# Patient Record
Sex: Male | Born: 1965 | Race: White | Hispanic: No | Marital: Single | Smoking: Current every day smoker
Health system: Southern US, Community
[De-identification: ages and names within clinical notes are randomized; demographics above are authoritative.]

## PROBLEM LIST (undated history)

## (undated) DIAGNOSIS — J449 Chronic obstructive pulmonary disease, unspecified: Secondary | ICD-10-CM

## (undated) DIAGNOSIS — J302 Other seasonal allergic rhinitis: Secondary | ICD-10-CM

## (undated) DIAGNOSIS — J45909 Unspecified asthma, uncomplicated: Secondary | ICD-10-CM

## (undated) DIAGNOSIS — F329 Major depressive disorder, single episode, unspecified: Secondary | ICD-10-CM

## (undated) DIAGNOSIS — J189 Pneumonia, unspecified organism: Secondary | ICD-10-CM

## (undated) DIAGNOSIS — M109 Gout, unspecified: Secondary | ICD-10-CM

## (undated) DIAGNOSIS — Z9981 Dependence on supplemental oxygen: Secondary | ICD-10-CM

## (undated) DIAGNOSIS — F419 Anxiety disorder, unspecified: Secondary | ICD-10-CM

## (undated) DIAGNOSIS — F32A Depression, unspecified: Secondary | ICD-10-CM

## (undated) DIAGNOSIS — R0600 Dyspnea, unspecified: Secondary | ICD-10-CM

## (undated) DIAGNOSIS — F319 Bipolar disorder, unspecified: Secondary | ICD-10-CM

## (undated) DIAGNOSIS — K219 Gastro-esophageal reflux disease without esophagitis: Secondary | ICD-10-CM

## (undated) DIAGNOSIS — I10 Essential (primary) hypertension: Secondary | ICD-10-CM

## (undated) DIAGNOSIS — E119 Type 2 diabetes mellitus without complications: Secondary | ICD-10-CM

## (undated) DIAGNOSIS — R490 Dysphonia: Secondary | ICD-10-CM

## (undated) DIAGNOSIS — G4730 Sleep apnea, unspecified: Secondary | ICD-10-CM

## (undated) DIAGNOSIS — E785 Hyperlipidemia, unspecified: Secondary | ICD-10-CM

## (undated) DIAGNOSIS — Z972 Presence of dental prosthetic device (complete) (partial): Secondary | ICD-10-CM

## (undated) DIAGNOSIS — M1990 Unspecified osteoarthritis, unspecified site: Secondary | ICD-10-CM

## (undated) DIAGNOSIS — Z973 Presence of spectacles and contact lenses: Secondary | ICD-10-CM

## (undated) DIAGNOSIS — F819 Developmental disorder of scholastic skills, unspecified: Secondary | ICD-10-CM

## (undated) HISTORY — PX: TONSILLECTOMY: SUR1361

## (undated) HISTORY — PX: OTHER SURGICAL HISTORY: SHX169

## (undated) HISTORY — PX: MULTIPLE TOOTH EXTRACTIONS: SHX2053

---
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
## 2012-05-08 DIAGNOSIS — R05 Cough: Secondary | ICD-10-CM | POA: Insufficient documentation

## 2012-05-08 DIAGNOSIS — IMO0001 Reserved for inherently not codable concepts without codable children: Secondary | ICD-10-CM | POA: Insufficient documentation

## 2012-05-08 DIAGNOSIS — R059 Cough, unspecified: Secondary | ICD-10-CM | POA: Insufficient documentation

## 2012-09-03 DIAGNOSIS — M47817 Spondylosis without myelopathy or radiculopathy, lumbosacral region: Secondary | ICD-10-CM | POA: Insufficient documentation

## 2015-02-26 HISTORY — PX: OTHER SURGICAL HISTORY: SHX169

## 2015-03-12 DIAGNOSIS — E119 Type 2 diabetes mellitus without complications: Secondary | ICD-10-CM | POA: Insufficient documentation

## 2015-03-12 DIAGNOSIS — R0602 Shortness of breath: Secondary | ICD-10-CM | POA: Insufficient documentation

## 2015-03-12 DIAGNOSIS — J449 Chronic obstructive pulmonary disease, unspecified: Secondary | ICD-10-CM | POA: Insufficient documentation

## 2015-03-12 DIAGNOSIS — G4730 Sleep apnea, unspecified: Secondary | ICD-10-CM | POA: Insufficient documentation

## 2015-03-12 DIAGNOSIS — Q792 Exomphalos: Secondary | ICD-10-CM | POA: Insufficient documentation

## 2015-03-16 DIAGNOSIS — L03311 Cellulitis of abdominal wall: Secondary | ICD-10-CM | POA: Insufficient documentation

## 2015-07-15 ENCOUNTER — Ambulatory Visit (INDEPENDENT_AMBULATORY_CARE_PROVIDER_SITE_OTHER): Payer: Medicaid Other | Admitting: Sports Medicine

## 2015-07-15 ENCOUNTER — Encounter: Payer: Self-pay | Admitting: Sports Medicine

## 2015-07-15 DIAGNOSIS — E1142 Type 2 diabetes mellitus with diabetic polyneuropathy: Secondary | ICD-10-CM

## 2015-07-15 DIAGNOSIS — M79671 Pain in right foot: Secondary | ICD-10-CM

## 2015-07-15 DIAGNOSIS — B351 Tinea unguium: Secondary | ICD-10-CM

## 2015-07-15 DIAGNOSIS — L309 Dermatitis, unspecified: Secondary | ICD-10-CM | POA: Diagnosis not present

## 2015-07-15 DIAGNOSIS — M79672 Pain in left foot: Secondary | ICD-10-CM | POA: Diagnosis not present

## 2015-07-15 MED ORDER — CLOTRIMAZOLE-BETAMETHASONE 1-0.05 % EX CREA: 1.0000 "application " | TOPICAL_CREAM | Freq: Two times a day (BID) | CUTANEOUS | Status: DC

## 2015-07-15 NOTE — Progress Notes (Signed)
Patient ID: Dustin Vaughn, male   DOB: 07-05-1965, 50 y.o.   MRN: [id number] Subjective: Dustin Vaughn is a 50 y.o. male patient with history of type 2 diabetes who presents to office today complaining of long, painful nails  while ambulating in shoes; unable to trim. Patient states that the glucose reading this morning was 140 mg/dl. Patient denies any new changes in medication or new problems. Patient denies any new cramping, numbness, burning or tingling in the legs. Reports that he is on lyric for neuropathy pain. No other issues.   There are no active problems to display for this patient.  No current outpatient prescriptions on file prior to visit.   No current facility-administered medications on file prior to visit.   Not on File   Labs: HEMOGLOBIN A1C- no recent lab   Objective: General: Patient is awake, alert, and oriented x 3 and in no acute distress.  Integument: Skin is warm, dry and supple bilateral. Nails are tender, long, thickened and  dystrophic with subungual debris, consistent with onychomycosis, 1-5 bilateral. Mild scaly skin to plantar surfaces of both feet and white stuck on plaques on the lower legs and feet with no signs of infection. No open lesions or preulcerative lesions present bilateral. Remaining integument unremarkable.  Vasculature:  Dorsalis Pedis pulse 2/4 bilateral. Posterior Tibial pulse  1/4 bilateral.  Capillary fill time <3 sec 1-5 bilateral. Scant hair growth to the level of the digits. Temperature gradient within normal limits. No varicosities present bilateral. No edema present bilateral.   Neurology: The patient has diminished sensation measured with a 5.07/10g Semmes Weinstein Monofilament at all pedal sites bilateral . Vibratory sensation diminished bilateral with tuning fork. No Babinski sign present bilateral.   Musculoskeletal: No gross pedal deformities noted bilateral. Muscular strength within normal limits in all lower extremity muscular  groups bilateral without pain or limitation on range of motion . No tenderness with calf compression bilateral.  Assessment and Plan: Problem List Items Addressed This Visit    None    Visit Diagnoses    Dermatophytosis of nail    -  Primary    Relevant Medications    clotrimazole-betamethasone (LOTRISONE) cream    Foot pain, bilateral        Diabetic polyneuropathy associated with type 2 diabetes mellitus (HCC)        Dermatitis        Relevant Medications    clotrimazole-betamethasone (LOTRISONE) cream      -Examined patient. -Discussed and educated patient on diabetic foot care, especially with  regards to the vascular, neurological and musculoskeletal systems.  -Stressed the importance of good glycemic control and the detriment of not  controlling glucose levels in relation to the foot. -Mechanically debrided all nails 1-5 bilateral using sterile nail nipper and filed with dremel without incident  -Rx Lotrisone cream to use to both feet and legs daily  -Answered all patient questions -Patient to return  in 3 months for at risk foot care -Advised patient to consider diabetic education classes; patient states that he will think about it and does not want to do it at this time -Patient advised to call the office if any problems or questions arise in the  Meantime.  Asencion Islam, DPM

## 2015-10-14 ENCOUNTER — Encounter: Payer: Self-pay | Admitting: Sports Medicine

## 2015-10-14 ENCOUNTER — Ambulatory Visit (INDEPENDENT_AMBULATORY_CARE_PROVIDER_SITE_OTHER): Payer: Medicaid Other | Admitting: Sports Medicine

## 2015-10-14 DIAGNOSIS — B351 Tinea unguium: Secondary | ICD-10-CM | POA: Diagnosis not present

## 2015-10-14 DIAGNOSIS — M79672 Pain in left foot: Secondary | ICD-10-CM

## 2015-10-14 DIAGNOSIS — E1142 Type 2 diabetes mellitus with diabetic polyneuropathy: Secondary | ICD-10-CM | POA: Diagnosis not present

## 2015-10-14 DIAGNOSIS — L309 Dermatitis, unspecified: Secondary | ICD-10-CM | POA: Diagnosis not present

## 2015-10-14 DIAGNOSIS — M79671 Pain in right foot: Secondary | ICD-10-CM

## 2015-10-14 NOTE — Progress Notes (Signed)
Patient ID: Dustin Vaughn, male   DOB: August 18, 1965, 50 y.o.   MRN: [id number]   Subjective: Dustin Vaughn is a 50 y.o. male patient with history of type 2 diabetes who presents to office today complaining of long, painful nails  while ambulating in shoes; unable to trim. Patient states that the glucose reading this morning was 164 mg/dl. Patient denies any new changes in medication or new problems. Patient denies any new cramping, numbness, burning or tingling in the legs No other issues.   Patient sees Dr. Tyler Vaughn, Neurology.   There are no active problems to display for this patient.  Current Outpatient Prescriptions on File Prior to Visit  Medication Sig Dispense Refill  . allopurinol (ZYLOPRIM) 100 MG tablet Take 100 mg by mouth daily.    . baclofen (LIORESAL) 10 MG tablet Take 10 mg by mouth 3 (three) times daily.    . Budesonide-Formoterol Fumarate (SYMBICORT IN) Inhale into the lungs.    . clotrimazole-betamethasone (LOTRISONE) cream Apply 1 application topically 2 (two) times daily. 30 g 2  . dexlansoprazole (DEXILANT) 60 MG capsule Take 60 mg by mouth daily.    Marland Kitchen docusate sodium (COLACE) 100 MG capsule Take 100 mg by mouth 2 (two) times daily.    . insulin NPH-regular Human (NOVOLIN 70/30) (70-30) 100 UNIT/ML injection Inject 30 Units into the skin 2 (two) times daily.    Marland Kitchen lisinopril-hydrochlorothiazide (PRINZIDE,ZESTORETIC) 20-12.5 MG tablet Take 1 tablet by mouth daily.    Marland Kitchen loratadine (CLARITIN) 10 MG tablet Take 10 mg by mouth daily.    Marland Kitchen lovastatin (MEVACOR) 10 MG tablet Take 10 mg by mouth at bedtime.    . metoCLOPramide (REGLAN) 10 MG tablet Take 10 mg by mouth 4 (four) times daily.    . mometasone (NASONEX) 50 MCG/ACT nasal spray Place 2 sprays into the nose daily.    . montelukast (SINGULAIR) 10 MG tablet Take 10 mg by mouth at bedtime.    . polyethylene glycol (MIRALAX / GLYCOLAX) packet Take 17 g by mouth daily.    . pregabalin (LYRICA) 200 MG capsule Take 200 mg by mouth 2  (two) times daily.    . psyllium (METAMUCIL) 58.6 % powder Take 1 packet by mouth 3 (three) times daily.    . ranitidine (ZANTAC) 300 MG capsule Take 300 mg by mouth every evening.     No current facility-administered medications on file prior to visit.   Not on File   Objective: General: Patient is awake, alert, and oriented x 3 and in no acute distress.  Integument: Skin is warm, dry and supple bilateral. Nails are tender, long, thickened and  dystrophic with subungual debris, consistent with onychomycosis, 1-5 bilateral. Mild scaly skin to plantar surfaces of both feet and white stuck on plaques on the lower legs and feet with no signs of infection, improving in nature. No open lesions or preulcerative lesions present bilateral. Remaining integument unremarkable.  Vasculature:  Dorsalis Pedis pulse 2/4 bilateral. Posterior Tibial pulse  1/4 bilateral.  Capillary fill time <3 sec 1-5 bilateral. Scant hair growth to the level of the digits. Temperature gradient within normal limits. No varicosities present bilateral. No edema present bilateral.   Neurology: The patient has diminished sensation measured with a 5.07/10g Semmes Weinstein Monofilament at all pedal sites bilateral . Vibratory sensation diminished bilateral with tuning fork. No Babinski sign present bilateral.   Musculoskeletal: No gross pedal deformities noted bilateral. Muscular strength within normal limits in all lower extremity muscular groups bilateral without pain  or limitation on range of motion . No tenderness with calf compression bilateral.  Assessment and Plan: Problem List Items Addressed This Visit    None    Visit Diagnoses    Dermatophytosis of nail    -  Primary    Foot pain, bilateral        Diabetic polyneuropathy associated with type 2 diabetes mellitus (HCC)        Dermatitis          -Examined patient. -Discussed and educated patient on diabetic foot care, especially with  regards to the vascular,  neurological and musculoskeletal systems.  -Stressed the importance of good glycemic control and the detriment of not  controlling glucose levels in relation to the foot. -Mechanically debrided all nails 1-5 bilateral using sterile nail nipper and filed with dremel without incident  -Cont with Lotrisone cream to use to both feet and legs daily  -Answered all patient questions -Patient to return  in 3 months for at risk foot care -Patient advised to call the office if any problems or questions arise in the meantime.  Dustin Vaughn, DPM

## 2015-10-22 DIAGNOSIS — H6120 Impacted cerumen, unspecified ear: Secondary | ICD-10-CM | POA: Insufficient documentation

## 2015-10-22 DIAGNOSIS — H7290 Unspecified perforation of tympanic membrane, unspecified ear: Secondary | ICD-10-CM | POA: Insufficient documentation

## 2015-10-22 DIAGNOSIS — R490 Dysphonia: Secondary | ICD-10-CM | POA: Insufficient documentation

## 2015-10-22 DIAGNOSIS — H6980 Other specified disorders of Eustachian tube, unspecified ear: Secondary | ICD-10-CM | POA: Insufficient documentation

## 2016-01-13 ENCOUNTER — Ambulatory Visit (INDEPENDENT_AMBULATORY_CARE_PROVIDER_SITE_OTHER): Payer: Medicaid Other | Admitting: Sports Medicine

## 2016-01-13 ENCOUNTER — Encounter: Payer: Self-pay | Admitting: Sports Medicine

## 2016-01-13 DIAGNOSIS — E1142 Type 2 diabetes mellitus with diabetic polyneuropathy: Secondary | ICD-10-CM

## 2016-01-13 DIAGNOSIS — B351 Tinea unguium: Secondary | ICD-10-CM | POA: Diagnosis not present

## 2016-01-13 DIAGNOSIS — M79672 Pain in left foot: Secondary | ICD-10-CM | POA: Diagnosis not present

## 2016-01-13 DIAGNOSIS — M79671 Pain in right foot: Secondary | ICD-10-CM

## 2016-01-13 NOTE — Progress Notes (Signed)
Patient ID: Dustin Vaughn, male   DOB: 03/07/1966, 50 y.o.   MRN: [id number]  Subjective: Dustin SauerJerry Vaughn is a 50 y.o. male patient with history of type 2 diabetes who presents to office today complaining of long, painful nails  while ambulating in shoes; unable to trim. Patient states that the glucose reading this morning not recorded but has been up and down. Patient denies any new changes in medication or new problems. Patient denies any new cramping, numbness, burning or tingling in the legs No other issues.   Patient sees Dr. Tyler DeisWheeler, Neurology.   Patient Active Problem List   Diagnosis Date Noted  . Dysfunction of eustachian tube 10/22/2015  . Ceruminosis 10/22/2015  . Hoarse 10/22/2015  . Ear drum perforation 10/22/2015  . Cellulitis of abdominal wall 03/16/2015  . Chronic obstructive pulmonary disease (HCC) 03/12/2015  . Diabetes mellitus (HCC) 03/12/2015  . Breath shortness 03/12/2015  . Apnea, sleep 03/12/2015  . Exomphalos 03/12/2015  . LBP (low back pain) 09/03/2012  . Lumbosacral spondylosis 09/03/2012  . Cough 05/08/2012  . Can't get food down 05/08/2012  . Brash 05/08/2012   Current Outpatient Prescriptions on File Prior to Visit  Medication Sig Dispense Refill  . allopurinol (ZYLOPRIM) 100 MG tablet Take 100 mg by mouth daily.    . baclofen (LIORESAL) 10 MG tablet Take 10 mg by mouth 3 (three) times daily.    . Budesonide-Formoterol Fumarate (SYMBICORT IN) Inhale into the lungs.    . clotrimazole-betamethasone (LOTRISONE) cream Apply 1 application topically 2 (two) times daily. 30 g 2  . dexlansoprazole (DEXILANT) 60 MG capsule Take 60 mg by mouth daily.    Marland Kitchen. docusate sodium (COLACE) 100 MG capsule Take 100 mg by mouth 2 (two) times daily.    Marland Kitchen. ibuprofen (ADVIL,MOTRIN) 800 MG tablet Take by mouth.    . insulin NPH-regular Human (NOVOLIN 70/30) (70-30) 100 UNIT/ML injection Inject 30 Units into the skin 2 (two) times daily.    Marland Kitchen. lisinopril-hydrochlorothiazide  (PRINZIDE,ZESTORETIC) 20-12.5 MG tablet Take 1 tablet by mouth daily.    Marland Kitchen. loratadine (CLARITIN) 10 MG tablet Take 10 mg by mouth daily.    Marland Kitchen. lovastatin (MEVACOR) 10 MG tablet Take 10 mg by mouth at bedtime.    . metoCLOPramide (REGLAN) 10 MG tablet Take 10 mg by mouth 4 (four) times daily.    . mometasone (NASONEX) 50 MCG/ACT nasal spray Place 2 sprays into the nose daily.    . montelukast (SINGULAIR) 10 MG tablet Take 10 mg by mouth at bedtime.    . polyethylene glycol (MIRALAX / GLYCOLAX) packet Take 17 g by mouth daily.    . pregabalin (LYRICA) 200 MG capsule Take 200 mg by mouth 2 (two) times daily.    . psyllium (METAMUCIL) 58.6 % powder Take 1 packet by mouth 3 (three) times daily.    . ranitidine (ZANTAC) 300 MG capsule Take 300 mg by mouth every evening.     No current facility-administered medications on file prior to visit.   No Known Allergies   Objective: General: Patient is awake, alert, and oriented x 3 and in no acute distress.  Integument: Skin is warm, dry and supple bilateral. Nails are tender, long, thickened and  dystrophic with subungual debris, consistent with onychomycosis, 1-5 bilateral. Mild scaly skin to plantar surfaces of both feet and white stuck on plaques on the lower legs and feet with no signs of infection, improving in nature. No open lesions or preulcerative lesions present bilateral. Remaining integument unremarkable.  Vasculature:  Dorsalis Pedis pulse 2/4 bilateral. Posterior Tibial pulse  1/4 bilateral.  Capillary fill time <3 sec 1-5 bilateral. Scant hair growth to the level of the digits. Temperature gradient within normal limits. No varicosities present bilateral. No edema present bilateral.   Neurology: The patient has diminished sensation measured with a 5.07/10g Semmes Weinstein Monofilament at all pedal sites bilateral . Vibratory sensation diminished bilateral with tuning fork. No Babinski sign present bilateral.   Musculoskeletal: No gross  pedal deformities noted bilateral. Muscular strength within normal limits in all lower extremity muscular groups bilateral without pain or limitation on range of motion . No tenderness with calf compression bilateral.  Assessment and Plan: Problem List Items Addressed This Visit    None    Visit Diagnoses    Dermatophytosis of nail    -  Primary    Foot pain, bilateral        Diabetic polyneuropathy associated with type 2 diabetes mellitus (HCC)          -Examined patient. -Discussed and educated patient on diabetic foot care, especially with  regards to the vascular, neurological and musculoskeletal systems.  -Stressed the importance of good glycemic control and the detriment of not  controlling glucose levels in relation to the foot. -Mechanically debrided all nails 1-5 bilateral using sterile nail nipper and filed with dremel without incident  -Cont with Lotrisone cream to use to both feet and legs daily  -Answered all patient questions -Patient to return  in 3 months for at risk foot care -Patient advised to call the office if any problems or questions arise in the meantime.  Asencion Islam, DPM

## 2016-04-13 ENCOUNTER — Encounter: Payer: Self-pay | Admitting: Sports Medicine

## 2016-04-13 ENCOUNTER — Ambulatory Visit (INDEPENDENT_AMBULATORY_CARE_PROVIDER_SITE_OTHER): Payer: Medicaid Other | Admitting: Sports Medicine

## 2016-04-13 DIAGNOSIS — M79671 Pain in right foot: Secondary | ICD-10-CM | POA: Diagnosis not present

## 2016-04-13 DIAGNOSIS — B351 Tinea unguium: Secondary | ICD-10-CM

## 2016-04-13 DIAGNOSIS — M79672 Pain in left foot: Secondary | ICD-10-CM | POA: Diagnosis not present

## 2016-04-13 DIAGNOSIS — E1142 Type 2 diabetes mellitus with diabetic polyneuropathy: Secondary | ICD-10-CM

## 2016-04-13 DIAGNOSIS — M2040 Other hammer toe(s) (acquired), unspecified foot: Secondary | ICD-10-CM

## 2016-04-13 NOTE — Progress Notes (Signed)
Patient ID: Dustin Vaughn, male   DOB: 1965-08-15, 50 y.o.   MRN: [id number]  Subjective: Dustin Vaughn is a 50 y.o. male patient with history of type 2 diabetes who presents to office today complaining of long, painful nails  while ambulating in shoes; unable to trim. Patient states that the glucose reading this morning not recorded but has been :good". Desires diabetic shoes. Patient denies any new changes in medication or new problems. Patient denies any new cramping, numbness, burning or tingling in the legs No other issues.   Patient sees Dr. Tyler Deis, Neurology.   Patient Active Problem List   Diagnosis Date Noted  . Dysfunction of eustachian tube 10/22/2015  . Ceruminosis 10/22/2015  . Hoarse 10/22/2015  . Ear drum perforation 10/22/2015  . Cellulitis of abdominal wall 03/16/2015  . Chronic obstructive pulmonary disease (HCC) 03/12/2015  . Diabetes mellitus (HCC) 03/12/2015  . Breath shortness 03/12/2015  . Apnea, sleep 03/12/2015  . Exomphalos 03/12/2015  . LBP (low back pain) 09/03/2012  . Lumbosacral spondylosis 09/03/2012  . Cough 05/08/2012  . Can't get food down 05/08/2012  . Brash 05/08/2012   Current Outpatient Prescriptions on File Prior to Visit  Medication Sig Dispense Refill  . allopurinol (ZYLOPRIM) 100 MG tablet Take 100 mg by mouth daily.    . baclofen (LIORESAL) 10 MG tablet Take 10 mg by mouth 3 (three) times daily.    . Budesonide-Formoterol Fumarate (SYMBICORT IN) Inhale into the lungs.    . clotrimazole-betamethasone (LOTRISONE) cream Apply 1 application topically 2 (two) times daily. 30 g 2  . dexlansoprazole (DEXILANT) 60 MG capsule Take 60 mg by mouth daily.    Marland Kitchen docusate sodium (COLACE) 100 MG capsule Take 100 mg by mouth 2 (two) times daily.    Marland Kitchen ibuprofen (ADVIL,MOTRIN) 800 MG tablet Take by mouth.    . insulin NPH-regular Human (NOVOLIN 70/30) (70-30) 100 UNIT/ML injection Inject 30 Units into the skin 2 (two) times daily.    Marland Kitchen  lisinopril-hydrochlorothiazide (PRINZIDE,ZESTORETIC) 20-12.5 MG tablet Take 1 tablet by mouth daily.    Marland Kitchen loratadine (CLARITIN) 10 MG tablet Take 10 mg by mouth daily.    Marland Kitchen lovastatin (MEVACOR) 10 MG tablet Take 10 mg by mouth at bedtime.    . metoCLOPramide (REGLAN) 10 MG tablet Take 10 mg by mouth 4 (four) times daily.    . mometasone (NASONEX) 50 MCG/ACT nasal spray Place 2 sprays into the nose daily.    . montelukast (SINGULAIR) 10 MG tablet Take 10 mg by mouth at bedtime.    . polyethylene glycol (MIRALAX / GLYCOLAX) packet Take 17 g by mouth daily.    . pregabalin (LYRICA) 200 MG capsule Take 200 mg by mouth 2 (two) times daily.    . psyllium (METAMUCIL) 58.6 % powder Take 1 packet by mouth 3 (three) times daily.    . ranitidine (ZANTAC) 300 MG capsule Take 300 mg by mouth every evening.     No current facility-administered medications on file prior to visit.    No Known Allergies   Objective: General: Patient is awake, alert, and oriented x 3 and in no acute distress.  Integument: Skin is warm, dry and supple bilateral. Nails are tender, long, thickened and  dystrophic with subungual debris, consistent with onychomycosis, 1-5 bilateral. Mild scaly skin to plantar surfaces of both feet and white stuck on plaques on the lower legs and feet with no signs of infection, improving in nature. No open lesions or preulcerative lesions present bilateral. Remaining integument  unremarkable.  Vasculature:  Dorsalis Pedis pulse 2/4 bilateral. Posterior Tibial pulse  1/4 bilateral.  Capillary fill time <3 sec 1-5 bilateral. Scant hair growth to the level of the digits. Temperature gradient within normal limits. No varicosities present bilateral. No edema present bilateral.   Neurology: The patient has diminished sensation measured with a 5.07/10g Semmes Weinstein Monofilament at all pedal sites bilateral . Vibratory sensation diminished bilateral with tuning fork. No Babinski sign present  bilateral.   Musculoskeletal: Mild Asymptomatic hammertoe pedal deformities noted bilateral. Muscular strength within normal limits in all lower extremity muscular groups bilateral without pain or limitation on range of motion . No tenderness with calf compression bilateral.  Assessment and Plan: Problem List Items Addressed This Visit    None    Visit Diagnoses    Dermatophytosis of nail    -  Primary   Foot pain, bilateral       Diabetic polyneuropathy associated with type 2 diabetes mellitus (HCC)       Hammer toe, unspecified laterality         -Examined patient. -Discussed and educated patient on diabetic foot care, especially with  regards to the vascular, neurological and musculoskeletal systems.  -Stressed the importance of good glycemic control and the detriment of not  controlling glucose levels in relation to the foot. -Mechanically debrided all nails 1-5 bilateral using sterile nail nipper and filed with dremel without incident  -Cont with Lotrisone cream to use to both feet and legs daily  -Rx Diabetic shoes [facility]  -Answered all patient questions -Patient to return  in 3 months for at risk foot care -Patient advised to call the office if any problems or questions arise in the meantime.  Dustin Vaughn, DPM

## 2016-04-27 HISTORY — PX: OTHER SURGICAL HISTORY: SHX169

## 2016-07-14 ENCOUNTER — Ambulatory Visit: Payer: Medicaid Other | Admitting: Sports Medicine

## 2016-07-20 ENCOUNTER — Ambulatory Visit (INDEPENDENT_AMBULATORY_CARE_PROVIDER_SITE_OTHER): Payer: Medicaid Other | Admitting: Sports Medicine

## 2016-07-20 ENCOUNTER — Encounter: Payer: Self-pay | Admitting: Sports Medicine

## 2016-07-20 DIAGNOSIS — M79676 Pain in unspecified toe(s): Secondary | ICD-10-CM

## 2016-07-20 DIAGNOSIS — M2040 Other hammer toe(s) (acquired), unspecified foot: Secondary | ICD-10-CM

## 2016-07-20 DIAGNOSIS — M79672 Pain in left foot: Secondary | ICD-10-CM

## 2016-07-20 DIAGNOSIS — B351 Tinea unguium: Secondary | ICD-10-CM | POA: Diagnosis not present

## 2016-07-20 DIAGNOSIS — M79671 Pain in right foot: Secondary | ICD-10-CM

## 2016-07-20 DIAGNOSIS — E1142 Type 2 diabetes mellitus with diabetic polyneuropathy: Secondary | ICD-10-CM

## 2016-07-20 NOTE — Progress Notes (Signed)
Patient ID: Dustin Vaughn, male   DOB: 18-Aug-1965, 51 y.o.   MRN: [id number]  Subjective: Dustin Vaughn is a 51 y.o. male patient with history of type 2 diabetes who presents to office today complaining of long, painful nails  while ambulating in shoes; unable to trim. Patient states that the glucose reading this morning not recorded but has been :good". Has diabetic shoes. Patient denies any new changes in medication or new problems. Patient denies any new cramping, numbness, burning or tingling in the legs No other issues.   Patient sees Dr. Tyler Vaughn, Neurology.   Patient Active Problem List   Diagnosis Date Noted  . Dysfunction of eustachian tube 10/22/2015  . Ceruminosis 10/22/2015  . Hoarse 10/22/2015  . Ear drum perforation 10/22/2015  . Cellulitis of abdominal wall 03/16/2015  . Chronic obstructive pulmonary disease (HCC) 03/12/2015  . Diabetes mellitus (HCC) 03/12/2015  . Breath shortness 03/12/2015  . Apnea, sleep 03/12/2015  . Exomphalos 03/12/2015  . LBP (low back pain) 09/03/2012  . Lumbosacral spondylosis 09/03/2012  . Cough 05/08/2012  . Can't get food down 05/08/2012  . Brash 05/08/2012   Current Outpatient Prescriptions on File Prior to Visit  Medication Sig Dispense Refill  . allopurinol (ZYLOPRIM) 100 MG tablet Take 100 mg by mouth daily.    . baclofen (LIORESAL) 10 MG tablet Take 10 mg by mouth 3 (three) times daily.    . Budesonide-Formoterol Fumarate (SYMBICORT IN) Inhale into the lungs.    . clotrimazole-betamethasone (LOTRISONE) cream Apply 1 application topically 2 (two) times daily. 30 g 2  . dexlansoprazole (DEXILANT) 60 MG capsule Take 60 mg by mouth daily.    Marland Kitchen docusate sodium (COLACE) 100 MG capsule Take 100 mg by mouth 2 (two) times daily.    Marland Kitchen ibuprofen (ADVIL,MOTRIN) 800 MG tablet Take by mouth.    . insulin NPH-regular Human (NOVOLIN 70/30) (70-30) 100 UNIT/ML injection Inject 30 Units into the skin 2 (two) times daily.    Marland Kitchen  lisinopril-hydrochlorothiazide (PRINZIDE,ZESTORETIC) 20-12.5 MG tablet Take 1 tablet by mouth daily.    Marland Kitchen loratadine (CLARITIN) 10 MG tablet Take 10 mg by mouth daily.    Marland Kitchen lovastatin (MEVACOR) 10 MG tablet Take 10 mg by mouth at bedtime.    . metoCLOPramide (REGLAN) 10 MG tablet Take 10 mg by mouth 4 (four) times daily.    . mometasone (NASONEX) 50 MCG/ACT nasal spray Place 2 sprays into the nose daily.    . montelukast (SINGULAIR) 10 MG tablet Take 10 mg by mouth at bedtime.    . polyethylene glycol (MIRALAX / GLYCOLAX) packet Take 17 g by mouth daily.    . pregabalin (LYRICA) 200 MG capsule Take 200 mg by mouth 2 (two) times daily.    . psyllium (METAMUCIL) 58.6 % powder Take 1 packet by mouth 3 (three) times daily.    . ranitidine (ZANTAC) 300 MG capsule Take 300 mg by mouth every evening.     No current facility-administered medications on file prior to visit.    No Known Allergies   Objective: General: Patient is awake, alert, and oriented x 3 and in no acute distress.  Integument: Skin is warm, dry and supple bilateral. Nails are tender, long, thickened and  dystrophic with subungual debris, consistent with onychomycosis, 1-5 bilateral. Mild scaly skin to plantar surfaces of both feet and white stuck on plaques on the lower legs and feet with no signs of infection, improving in nature. No open lesions or preulcerative lesions present bilateral. Remaining integument  unremarkable.  Vasculature:  Dorsalis Pedis pulse 2/4 bilateral. Posterior Tibial pulse  1/4 bilateral.  Capillary fill time <3 sec 1-5 bilateral. Scant hair growth to the level of the digits. Temperature gradient within normal limits. No varicosities present bilateral. No edema present bilateral.   Neurology: The patient has diminished sensation measured with a 5.07/10g Semmes Weinstein Monofilament at all pedal sites bilateral . Vibratory sensation diminished bilateral with tuning fork. No Babinski sign present  bilateral.   Musculoskeletal: Mild Asymptomatic hammertoe pedal deformities noted bilateral. Muscular strength within normal limits in all lower extremity muscular groups bilateral without pain or limitation on range of motion . No tenderness with calf compression bilateral.  Assessment and Plan: Problem List Items Addressed This Visit    None    Visit Diagnoses    Dermatophytosis of nail    -  Primary   Hammer toe, unspecified laterality       Foot pain, bilateral       Diabetic polyneuropathy associated with type 2 diabetes mellitus (HCC)         -Examined patient. -Discussed and educated patient on diabetic foot care, especially with  regards to the vascular, neurological and musculoskeletal systems.  -Stressed the importance of good glycemic control and the detriment of not  controlling glucose levels in relation to the foot. -Mechanically debrided all nails 1-5 bilateral using sterile nail nipper and filed with dremel without incident  -Cont with Lotrisone cream to use to both feet and legs daily  -Continue with diabetic shoes from [facility]  -Answered all patient questions -Patient to return  in 3 months for at risk foot care -Patient advised to call the office if any problems or questions arise in the meantime.  Dustin Vaughn, DPM

## 2016-10-19 ENCOUNTER — Ambulatory Visit: Payer: Medicaid Other | Admitting: Sports Medicine

## 2019-08-02 ENCOUNTER — Other Ambulatory Visit: Payer: Self-pay

## 2019-08-02 ENCOUNTER — Emergency Department (HOSPITAL_COMMUNITY): Payer: Medicaid Other

## 2019-08-02 ENCOUNTER — Emergency Department (HOSPITAL_COMMUNITY)
Admission: EM | Admit: 2019-08-02 | Discharge: 2019-08-02 | Disposition: A | Discharge status: Home / Self Care | Payer: Medicaid Other | Attending: Emergency Medicine | Admitting: Emergency Medicine

## 2019-08-02 ENCOUNTER — Encounter (HOSPITAL_COMMUNITY): Payer: Self-pay | Admitting: Emergency Medicine

## 2019-08-02 DIAGNOSIS — S6991XD Unspecified injury of right wrist, hand and finger(s), subsequent encounter: Secondary | ICD-10-CM | POA: Diagnosis present

## 2019-08-02 DIAGNOSIS — Z79899 Other long term (current) drug therapy: Secondary | ICD-10-CM | POA: Diagnosis not present

## 2019-08-02 DIAGNOSIS — Y999 Unspecified external cause status: Secondary | ICD-10-CM | POA: Diagnosis not present

## 2019-08-02 DIAGNOSIS — S52691K Other fracture of lower end of right ulna, subsequent encounter for closed fracture with nonunion: Secondary | ICD-10-CM | POA: Insufficient documentation

## 2019-08-02 DIAGNOSIS — S52501K Unspecified fracture of the lower end of right radius, subsequent encounter for closed fracture with nonunion: Secondary | ICD-10-CM

## 2019-08-02 DIAGNOSIS — Y929 Unspecified place or not applicable: Secondary | ICD-10-CM | POA: Insufficient documentation

## 2019-08-02 DIAGNOSIS — E119 Type 2 diabetes mellitus without complications: Secondary | ICD-10-CM | POA: Insufficient documentation

## 2019-08-02 DIAGNOSIS — W010XXA Fall on same level from slipping, tripping and stumbling without subsequent striking against object, initial encounter: Secondary | ICD-10-CM | POA: Insufficient documentation

## 2019-08-02 DIAGNOSIS — Z794 Long term (current) use of insulin: Secondary | ICD-10-CM | POA: Insufficient documentation

## 2019-08-02 DIAGNOSIS — Y939 Activity, unspecified: Secondary | ICD-10-CM | POA: Diagnosis not present

## 2019-08-02 MED ORDER — NICOTINE 21 MG/24HR TD PT24
21.0000 mg | MEDICATED_PATCH | Freq: Every day | TRANSDERMAL | Status: DC
  Administered 2019-08-02: 21 mg via TRANSDERMAL
  Filled 2019-08-02: qty 1

## 2019-08-02 MED ORDER — OXYCODONE HCL 5 MG PO TABS
10.0000 mg | ORAL_TABLET | Freq: Once | ORAL | Status: AC
  Administered 2019-08-02: 18:00:00 10 mg via ORAL
  Filled 2019-08-02: qty 2

## 2019-08-02 MED ORDER — OXYCODONE HCL 5 MG PO TABS
5.0000 mg | ORAL_TABLET | Freq: Once | ORAL | Status: AC
  Administered 2019-08-02: 5 mg via ORAL
  Filled 2019-08-02: qty 1

## 2019-08-02 NOTE — Progress Notes (Signed)
Orthopedic Tech Progress Note Patient Details:  Dustin Vaughn 1965-11-08 [id number]  Ortho Devices Type of Ortho Device: Arm sling, Volar splint Ortho Device/Splint Location: RUE Ortho Device/Splint Interventions: Application, Ordered   Post Interventions Patient Tolerated: Well Instructions Provided: Care of device, Adjustment of device   Donald Pore 08/02/2019, 7:01 PM

## 2019-08-02 NOTE — Discharge Instructions (Signed)
1. Medications: alternate naprosyn and tylenol for pain control, usual home medications 2. Treatment: rest, ice, elevate and use brace, drink plenty of fluids, gentle stretching 3. Follow Up: Please followup with orthopedics as directed for definitive treatment and/or repair; Please return to the ER for worsening symptoms or other concerns

## 2019-08-02 NOTE — ED Triage Notes (Signed)
Pt c/o right wrist pain and fracture x 3 months. States that he was supposed to have surgery on wrist but it was cancelled. Pt not wearing splint.

## 2019-08-02 NOTE — ED Provider Notes (Signed)
[facility] EMERGENCY DEPARTMENT Provider Note   CSN: [id number] Arrival date & time: 08/02/19  1607     History Chief Complaint  Patient presents with  . Wrist Pain    Dustin Vaughn is a 54 y.o. male with a hx of COPD non-insulin-dependent diabetes, chronic low back pain presents to the Emergency Department complaining of acute, persistent pain in the right wrist.  Patient reports he broke his wrist in September 2020.  He reports he wore a cast for period of time and afterwards a wrist splint.  2 weeks ago he reports he fell creating the deformity that he currently has.  He was not evaluated at that time.  Patient reports he has not had any pain medicine for this and has been smoking marijuana to help instead.  Movement and palpation make his pain significantly worse.  Nothing seems to make it better.  Review of the [place] narcotic database does show that patient has a consistent prescription for Suboxone which was last filled on 07/17/2019.  Patient reports significant pain and deformity in the right wrist.  He reports intact sensation to the right hand and movement of all fingers but this does create significant pain.  Additional records reviewed.  Patient was diagnosed with this fracture at [facility] and evaluated by an orthopedist with [facility] however they did not do surgery.  Patient reports this is because he continues to smoke.   The history is provided by the patient and medical records. No language interpreter was used.       History reviewed. No pertinent past medical history.  Patient Active Problem List   Diagnosis Date Noted  . Dysfunction of eustachian tube 10/22/2015  . Ceruminosis 10/22/2015  . Hoarse 10/22/2015  . Ear drum perforation 10/22/2015  . Cellulitis of abdominal wall 03/16/2015  . Chronic obstructive pulmonary disease ([place]) 03/12/2015  . Diabetes mellitus ([place]) 03/12/2015  . Breath shortness 03/12/2015  . Apnea, sleep  03/12/2015  . Exomphalos 03/12/2015  . LBP (low back pain) 09/03/2012  . Lumbosacral spondylosis 09/03/2012  . Cough 05/08/2012  . Can't get food down 05/08/2012  . Brash 05/08/2012    No family history on file.  Social History   Tobacco Use  . Smoking status: Unknown If Ever Smoked  . Smokeless tobacco: Never Used  Substance Use Topics  . Alcohol use: Not on file  . Drug use: Not on file    Home Medications Prior to Admission medications   Medication Sig Start Date End Date Taking? Authorizing Provider  allopurinol (ZYLOPRIM) 100 MG tablet Take 100 mg by mouth daily.    [provider]  baclofen (LIORESAL) 10 MG tablet Take 10 mg by mouth 3 (three) times daily.    [provider]  Budesonide-Formoterol Fumarate (SYMBICORT IN) Inhale into the lungs.    [provider]  clotrimazole-betamethasone (LOTRISONE) cream Apply 1 application topically 2 (two) times daily. 07/15/15   Landis Martins, DPM  dexlansoprazole (DEXILANT) 60 MG capsule Take 60 mg by mouth daily.    [provider]  docusate sodium (COLACE) 100 MG capsule Take 100 mg by mouth 2 (two) times daily.    [provider]  ibuprofen (ADVIL,MOTRIN) 800 MG tablet Take by mouth. 05/17/12   [provider]  insulin NPH-regular Human (NOVOLIN 70/30) (70-30) 100 UNIT/ML injection Inject 30 Units into the skin 2 (two) times daily.    [provider]  lisinopril-hydrochlorothiazide (PRINZIDE,ZESTORETIC) 20-12.5 MG tablet Take 1  tablet by mouth daily.    [provider]  loratadine (CLARITIN) 10 MG tablet Take 10 mg by mouth daily.    [provider]  lovastatin (MEVACOR) 10 MG tablet Take 10 mg by mouth at bedtime.    [provider]  metoCLOPramide (REGLAN) 10 MG tablet Take 10 mg by mouth 4 (four) times daily.    [provider]  mometasone (NASONEX) 50 MCG/ACT nasal spray Place 2 sprays into the nose daily.    [provider]  montelukast (SINGULAIR) 10 MG tablet Take 10 mg by mouth at bedtime.    [provider]  polyethylene glycol (MIRALAX / GLYCOLAX) packet Take 17 g by mouth daily.    [provider]  pregabalin (LYRICA) 200 MG capsule Take 200 mg by mouth 2 (two) times daily.    [provider]  psyllium (METAMUCIL) 58.6 % powder Take 1 packet by mouth 3 (three) times daily.    [provider]  ranitidine (ZANTAC) 300 MG capsule Take 300 mg by mouth every evening.    [provider]    Allergies    Patient has no known allergies.  Review of Systems   Review of Systems  Constitutional: Negative for chills and fever.  Gastrointestinal: Negative for nausea and vomiting.  Musculoskeletal: Positive for arthralgias and joint swelling. Negative for back pain, neck pain and neck stiffness.  Skin: Negative for wound.  Neurological: Negative for numbness.  Hematological: Does not bruise/bleed easily.  Psychiatric/Behavioral: The patient is not nervous/anxious.   All other systems reviewed and are negative.   Physical Exam Updated Vital Signs BP (!) 110/91   Pulse 64   Temp 97.9 F (36.6 C) (Oral)   Resp (!) 22   SpO2 97%   Physical Exam Vitals and nursing note reviewed.  Constitutional:      General: He is not in acute distress.    Appearance: He is well-developed. He is not diaphoretic.  HENT:     Head: Normocephalic and atraumatic.  Eyes:     Conjunctiva/sclera: Conjunctivae normal.  Cardiovascular:     Rate and Rhythm: Normal rate and regular rhythm.     Comments: Capillary refill < 3 sec Pulmonary:     Effort: Pulmonary effort is normal.     Breath sounds: Normal breath sounds.  Musculoskeletal:        General: Tenderness present.     Right wrist: Swelling, deformity, tenderness and bony tenderness present. Decreased range of motion.     Left wrist: No swelling, deformity, tenderness or bony tenderness. Normal range of motion.      Right hand: No tenderness. Normal range of motion. Normal sensation.     Left hand: No tenderness. Normal range of motion. Normal sensation.     Cervical back: Normal range of motion.     Comments: Significant deformity of the right wrist with tenting of the skin on the ulnar side.  No open wounds.  Full range of motion of all fingers of the right hand.  No movement at the right wrist.  Full range of motion at the right elbow.  Skin:    General: Skin is warm and dry.     Comments: No tenting of the skin  Neurological:     Mental Status: He is alert.     Coordination: Coordination normal.     Comments: Sensation intact to normal touch in the right upper extremity Strength 5/5 with dorsiflexion plantarflexion of all PIP, DIP and  MCP joints.  0/5 of the right wrist and 5/5 at the right elbow.     ED Results / Procedures / Treatments    Radiology DG Wrist Complete Right  Result Date: 08/02/2019 CLINICAL DATA:  Fracture 3 months ago, pain EXAM: RIGHT WRIST - COMPLETE 3+ VIEW COMPARISON:  03/09/2019 FINDINGS: Redemonstrated, displaced, comminuted intra-articular fractures of the distal right radius and ulnar styloid, with increased bony destruction and displacement of chronic fracture fragments when compared to examination dated 03/09/2019. Disuse osteopenia of the carpus. IMPRESSION: Redemonstrated, displaced, comminuted intra-articular fractures of the distal right radius and ulnar styloid, with increased bony destruction and displacement of chronic fracture fragments when compared to examination dated 03/09/2019. Electronically Signed   By: Lauralyn Primes M.D.   On: 08/02/2019 17:07   CT Wrist Right Wo Contrast  Result Date: 08/02/2019 CLINICAL DATA:  Wrist fracture for 3 months, worsening pain EXAM: CT OF THE RIGHT WRIST WITHOUT CONTRAST TECHNIQUE: Multidetector CT imaging of the right wrist was performed according to the standard protocol. Multiplanar CT image reconstructions were also  generated. COMPARISON:  Radiograph same day, March 09, 2019 radiograph FINDINGS: Bones/Joint/Cartilage Again noted is a comminuted impacted intra-articular distal radius fracture. There is interval worsening in the distal impaction of the radial shaft. Nonunited distal radius fracture fragments are seen along the volar and dorsal surface of the wrist. The distal radial shaft appears to have caused impaction injury upon the proximal scaphoid and inferior lunate. The ulna appears to be dorsally and medially displaced. Nonunited ulnar styloid osseous fragments are seen at the distal surface. There is diffuse osteopenia seen. Ligaments Suboptimally assessed by CT. Muscles and Tendons The muscles surrounding the wrist appear to be grossly intact without focal atrophy or tear. The flexor and extensor tendons appear to be grossly intact. Soft tissues Extensive dorsal soft tissue swelling is seen around the wrist. A moderate radioulnar joint effusion is seen. IMPRESSION: 1. Comminuted impacted intra-articular distal radius fracture with interval worsening distal impaction of the radial shaft on the scaphoid and lunate. 2. Multiple nonunited distal radius fracture fragments surrounding the wrist with no significant interval healing. 3. Medial and dorsal dislocation of the distal ulna. 4. Mildly displaced nonunited ulnar styloid fracture fragments. 5. Moderate radioulnar joint effusion Electronically Signed   By: Jonna Clark M.D.   On: 08/02/2019 20:36          Procedures .Splint Application  Date/Time: 08/02/2019 10:08 PM Performed by: Dierdre Forth, PA-C Authorized by: Dierdre Forth, PA-C   Consent:    Consent obtained:  Verbal   Consent given by:  Patient   Risks discussed:  Discoloration, numbness and pain   Alternatives discussed:  No treatment Pre-procedure details:    Sensation:  Normal   Skin color:  Pink Procedure details:    Laterality:  Right   Location:  Wrist   Wrist:   R wrist   Splint type:  Volar short arm   Supplies:  Ortho-Glass and elastic bandage Post-procedure details:    Pain:  Unchanged   Sensation:  Normal   Skin color:  Pink   Patient tolerance of procedure:  Tolerated well, no immediate complications   (including critical care time)  Medications Ordered in ED Medications  nicotine (NICODERM CQ - dosed in mg/24 hours) patch 21 mg (21 mg Transdermal Patch Applied 08/02/19 2035)  oxyCODONE (Oxy IR/ROXICODONE) immediate release tablet 5 mg (has no administration in time range)  oxyCODONE (Oxy IR/ROXICODONE) immediate release tablet 10 mg (10 mg Oral  Given 08/02/19 1742)    ED Course  I have reviewed the triage vital signs and the nursing notes.  Pertinent labs & imaging results that were available during my care of the patient were reviewed by me and considered in my medical decision making (see chart for details).  Clinical Course as of Aug 01 2212  Fri Aug 02, 2019  1827 Discussed with Dr. Roney Mans who recommends volar splint - no plaster over the ulna.  CT and outpatient follow-up   [HM]  1951 Patient continues to have pain will give additional pain medicine but will not discharge home with any given of oxygen use and length of time fracture has been present.   [HM]    Clinical Course User Index [HM] Lyvonne Cassell, Boyd Kerbs   MDM Rules/Calculators/A&P                      Patient presents with 2 weeks of fracture dislocation to the right wrist.  X-rays today are significantly worse when compared to x-rays in September 2020.  Discussed with Dr. Roney Mans who recommends volar splint and CT scan.  He will see in the office next week.  Discussed with patient who is grateful for this.  Pain medication given here prior to splint application.  Patient reports he will follow up as directed.   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

## 2019-08-02 NOTE — ED Notes (Signed)
When this RN went to check on pt & introduce self, pt requested nicotine patch, saying he "needed something strong" for urge. Message conveyed to [facility], will administer patch per order.

## 2019-08-02 NOTE — ED Notes (Signed)
Pt transported to xray 

## 2019-08-02 NOTE — ED Notes (Signed)
Pt expressed wanting to go outside & smoke a cigarette. This RN advised pt that if he were to go outside, he would have to "start all over" when he came back in. Pt expressed understanding & stated he would not leave, but that his craving was "getting bad again"

## 2019-08-29 ENCOUNTER — Encounter (HOSPITAL_COMMUNITY): Payer: Self-pay

## 2019-08-29 NOTE — Progress Notes (Signed)
Patient denies shortness of breath, fever, cough and chest pain.  PCP - Dr Karyl Kinnier Cardiologist - denies  Chest x-ray - 05/31/19 CE - 2 views EKG - 05/31/19 CE - requested tracing [facility] Stress Test - denies ECHO - denies Cardiac Cath - denies  Sleep Study - Yes CPAP - does not wear CPAP.  Uses 2L oxygen via [place] nightly and prn during day.  ERAS: Clears til 4:30 am DOS, water given (pt given G2 lower sugar gatorade drink).  Anesthesia review: Yes.  Revonda Standard, [place] saw patient at PAT appt.  Clearance for surgery on chart.  Diabetes - type 2, no meds, diet controlled per patient.  STOP now taking any Aspirin (unless otherwise instructed by your surgeon), Aleve, Naproxen, Ibuprofen, Motrin, Advil, Goody's, BC's, all herbal medications, fish oil, and all vitamins.   Coronavirus Screening Covid test scheduled on Friday 08/30/19 at 11:15 am after PAT appt. Have you experienced the following symptoms:  Cough yes/no: No Fever (>100.32F)  yes/no: No Runny nose yes/no: No Sore throat yes/no: No Difficulty breathing/shortness of breath  yes/no: No  Have you traveled in the last 14 days and where? yes/no: No  Patient verbalized understanding of instructions that were given to them at the PAT appointment.

## 2019-08-29 NOTE — Progress Notes (Addendum)
[facility] [address] [place] [address] Phone: [phone] Fax: [phone]  [place], [place] - [address] [place] [address] Phone: [phone] Fax: [phone]      Your procedure is scheduled on Monday, 09/02/19 at 7:30 A.M.  Report to [facility] Main Entrance "A" at 5:30 A.M., and check in at the Admitting office.  Call this number if you have problems the morning of surgery:  [phone]  Call [phone] if you have any questions prior to your surgery date Monday-Friday 8am-4pm    Remember:  Do not eat after midnight the night before your surgery - Sunday  You may drink clear liquids until 4:30 A.M. the morning of your surgery.   Clear liquids allowed are: Water, Non-Citrus Juices (without pulp), Carbonated Beverages, Clear Tea, Black Coffee Only, and Gatorade  Please complete your PRE-SURGERY ENSURE that was provided to you by 4:30 A.M. the morning of surgery.  Please, if able, drink it in one setting. DO NOT SIP.    Take these medicines the morning of surgery with A SIP OF WATER: acetaminophen (TYLENOL) if needed allopurinol (ZYLOPRIM)  budesonide-formoterol (SYMBICORT)  escitalopram (LEXAPRO) gabapentin (NEURONTIN) hydrOXYzine (ATARAX/VISTARIL) loratadine (CLARITIN)  mometasone (NASONEX) nasal spray if needed pantoprazole (PROTONIX)  pregabalin (LYRICA)  STOP now taking any Aspirin (unless otherwise instructed by your surgeon), Aleve, Naproxen, Ibuprofen, Motrin, Advil, Goody's, BC's, all herbal medications, fish oil, and all vitamins.    The Morning of Surgery  Do not wear jewelry.  Do not wear lotions, powders, colognes, or deodorant  Men may shave face and neck.  Do not bring valuables to the hospital.  [facility] is not responsible for any belongings or valuables.  If you are a smoker, DO NOT Smoke 24 hours prior to surgery  If you  wear a CPAP at night please bring your mask the morning of surgery   Remember that you must have someone to transport you home after your surgery, and remain with you for 24 hours if you are discharged the same day.  Please bring cases for contacts, glasses, hearing aids, dentures or bridgework because it cannot be worn into surgery.   Leave your suitcase in the car.  After surgery it may be brought to your room.  For patients admitted to the hospital, discharge time will be determined by your treatment team.  Patients discharged the day of surgery will not be allowed to drive home.    Special instructions:   [facility]- Preparing For Surgery  Before surgery, you can play an important role. Because skin is not sterile, your skin needs to be as free of germs as possible. You can reduce the number of germs on your skin by washing with CHG (chlorahexidine gluconate) Soap before surgery.  CHG is an antiseptic cleaner which kills germs and bonds with the skin to continue killing germs even after washing.    Oral Hygiene is also important to reduce your risk of infection.  Remember - BRUSH YOUR TEETH THE MORNING OF SURGERY WITH YOUR REGULAR TOOTHPASTE  Please do not use if you have an allergy to CHG or antibacterial soaps. If your skin becomes reddened/irritated stop using the CHG.  Do not shave (including legs and underarms) for at least 48 hours prior to first CHG shower. It is OK to shave your face.  Please follow these instructions carefully.   1. Shower  the NIGHT BEFORE SURGERY Sunday and the MORNING OF SURGERY Monday with CHG Soap.   2. If you chose to wash your hair, wash your hair first as usual with your normal shampoo.  3. After you shampoo, rinse your hair and body thoroughly to remove the shampoo.  4. Use CHG as you would any other liquid soap. You can apply CHG directly to the skin and wash gently with a scrungie or a clean washcloth.   5. Apply the CHG Soap to your body ONLY  FROM THE NECK DOWN.  Do not use on open wounds or open sores. Avoid contact with your eyes, ears, mouth and genitals (private parts). Wash Face and genitals (private parts)  with your normal soap.   6. Wash thoroughly, paying special attention to the area where your surgery will be performed.  7. Thoroughly rinse your body with warm water from the neck down.  8. DO NOT shower/wash with your normal soap after using and rinsing off the CHG Soap.  9. Pat yourself dry with a CLEAN TOWEL.  10. Wear CLEAN PAJAMAS to bed the night before surgery, wear comfortable clothes the morning of surgery  11. Place CLEAN SHEETS on your bed the night of your first shower and DO NOT SLEEP WITH PETS.    Day of Surgery:  Please shower the morning of surgery with the CHG soap Do not apply any deodorants/lotions. Please wear clean clothes to the hospital/surgery center.   Remember to brush your teeth WITH YOUR REGULAR TOOTHPASTE.   Please read over the following fact sheets that you were given.

## 2019-08-30 ENCOUNTER — Other Ambulatory Visit (HOSPITAL_COMMUNITY)
Admission: RE | Admit: 2019-08-30 | Discharge: 2019-08-30 | Disposition: A | Discharge status: Home / Self Care | Payer: Medicaid Other | Source: Ambulatory Visit | Attending: Orthopaedic Surgery | Admitting: Orthopaedic Surgery

## 2019-08-30 ENCOUNTER — Encounter (HOSPITAL_COMMUNITY)
Admission: RE | Admit: 2019-08-30 | Discharge: 2019-08-30 | Disposition: A | Discharge status: Home / Self Care | Payer: Medicaid Other | Source: Ambulatory Visit | Attending: Orthopaedic Surgery | Admitting: Orthopaedic Surgery

## 2019-08-30 ENCOUNTER — Other Ambulatory Visit: Payer: Self-pay

## 2019-08-30 ENCOUNTER — Encounter (HOSPITAL_COMMUNITY): Payer: Self-pay

## 2019-08-30 DIAGNOSIS — Z01812 Encounter for preprocedural laboratory examination: Secondary | ICD-10-CM | POA: Diagnosis present

## 2019-08-30 DIAGNOSIS — Z20822 Contact with and (suspected) exposure to covid-19: Secondary | ICD-10-CM | POA: Insufficient documentation

## 2019-08-30 HISTORY — DX: Dyspnea, unspecified: R06.00

## 2019-08-30 HISTORY — DX: Gastro-esophageal reflux disease without esophagitis: K21.9

## 2019-08-30 HISTORY — DX: Dependence on supplemental oxygen: Z99.81

## 2019-08-30 HISTORY — DX: Presence of spectacles and contact lenses: Z97.3

## 2019-08-30 HISTORY — DX: Bipolar disorder, unspecified: F31.9

## 2019-08-30 HISTORY — DX: Unspecified osteoarthritis, unspecified site: M19.90

## 2019-08-30 HISTORY — DX: Developmental disorder of scholastic skills, unspecified: F81.9

## 2019-08-30 HISTORY — DX: Chronic obstructive pulmonary disease, unspecified: J44.9

## 2019-08-30 HISTORY — DX: Presence of dental prosthetic device (complete) (partial): Z97.2

## 2019-08-30 HISTORY — DX: Dysphonia: R49.0

## 2019-08-30 HISTORY — DX: Pneumonia, unspecified organism: J18.9

## 2019-08-30 HISTORY — DX: Anxiety disorder, unspecified: F41.9

## 2019-08-30 HISTORY — DX: Sleep apnea, unspecified: G47.30

## 2019-08-30 HISTORY — DX: Depression, unspecified: F32.A

## 2019-08-30 HISTORY — DX: Unspecified asthma, uncomplicated: J45.909

## 2019-08-30 HISTORY — DX: Other seasonal allergic rhinitis: J30.2

## 2019-08-30 HISTORY — DX: Essential (primary) hypertension: I10

## 2019-08-30 HISTORY — DX: Gout, unspecified: M10.9

## 2019-08-30 HISTORY — DX: Type 2 diabetes mellitus without complications: E11.9

## 2019-08-30 HISTORY — DX: Hyperlipidemia, unspecified: E78.5

## 2019-08-30 LAB — BASIC METABOLIC PANEL
Anion gap: 9 (ref 5–15)
BUN: 9 mg/dL (ref 6–20)
CO2: 28 mmol/L (ref 22–32)
Calcium: 8.9 mg/dL (ref 8.9–10.3)
Chloride: 105 mmol/L (ref 98–111)
Creatinine, Ser: 1.01 mg/dL (ref 0.61–1.24)
GFR calc Af Amer: >60 mL/min (ref >60)
GFR calc non Af Amer: >60 mL/min (ref >60)
Glucose, Bld: 114 mg/dL — ABNORMAL HIGH (ref 70–99)
Potassium: 4.6 mmol/L (ref 3.5–5.1)
Sodium: 142 mmol/L (ref 135–145)

## 2019-08-30 LAB — CBC
HCT: 45 % (ref 39.0–52.0)
Hemoglobin: 14.6 g/dL (ref 13.0–17.0)
MCH: 30.4 pg (ref 26.0–34.0)
MCHC: 32.4 g/dL (ref 30.0–36.0)
MCV: 93.8 fL (ref 80.0–100.0)
Platelets: 207 10*3/uL (ref 150–400)
RBC: 4.8 MIL/uL (ref 4.22–5.81)
RDW: 13.5 % (ref 11.5–15.5)
WBC: 6.5 10*3/uL (ref 4.0–10.5)
nRBC: 0 % (ref 0.0–0.2)

## 2019-08-30 LAB — HEMOGLOBIN A1C
Hgb A1c MFr Bld: 5.3 % (ref 4.8–5.6)
Mean Plasma Glucose: 105.41 mg/dL

## 2019-08-30 LAB — GLUCOSE, CAPILLARY: Glucose-Capillary: 138 mg/dL — ABNORMAL HIGH (ref 70–99)

## 2019-08-30 LAB — SARS CORONAVIRUS 2 (TAT 6-24 HRS): SARS Coronavirus 2: NEGATIVE

## 2019-08-30 MED ORDER — DEXTROSE 5 % IV SOLN
3.0000 g | INTRAVENOUS | Status: DC
  Filled 2019-08-30: qty 3000

## 2019-08-30 MED ORDER — DEXTROSE 5 % IV SOLN
3.0000 g | INTRAVENOUS | Status: AC
  Administered 2019-09-02: 3 g via INTRAVENOUS
  Filled 2019-08-30: qty 3

## 2019-08-30 NOTE — Progress Notes (Signed)
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  History includes smoking (3PPD, down to ~ 1/2 PPD for the past 6 months), HTN, HLD, GERD, bipolar disorder, COPD (home O2, 2L/[place] at night and PRN day), asthma, OSA (intolerant to CPAP), hoarseness, exertional dyspnea, DM2, learning disability (limitations with reading and writing). S/p Open repair of incarcerated epigastric hernia and nonincarcerated umbilical hernia with mesh 03/13/15 and [facility]   - Admission 03/10/19-03/14/19 ([facility] with rhabdomyolysis and acute renal failure.  He was found down at home and minimally responsive.  Uncertainty regarding events that preceded his hospitalization.  There was concern for polysubstance abuse contributing to his altered mental status (drug screen + oxycodone).  Head CT showed no acute abnormality.  CK elevated at 4020 with Cr 7.01. Treated with IVF. CT concerning for aspiration pneumonia requiring antibiotics.  He did require intubation for worsening stridor. Ortho consulted subacute to chronic distal radial fracture with callus formation.  Creatinine improved to 0.73 by discharge. (He is unsure of the events prior to him being found down. He believes he fractures his wrist in the fall. He denied alcohol and illicit drug use other than marijuana. I didn't see mention of any concern for cardiac event in the discharge summary.) - Admission 12/16/18-12/23/18 ([facility] for acute kidney injury, metabolic acidosis, hypokalemia, likely due to  hypovolemia and diarrhea.  C. difficile was negative.  Chest x-ray concerning for bilateral lower lobe infiltrates status post antibiotic therapy. - Admission 09/01/18-09/03/18 ([facility]) for multifocal pneumonia, possible sigmoid colitis, metabolic encephalopathy.    I evaluated patient at his PAT visit due to medical history and home oxygen use.  RA O2 sat at PAT 97%. He is a long time smoker, previously 3 PPD, but has been down to ~ 1/2 PPD since he required multiple admissions last year. He reports he feels in his usual state of health--no recent acute respiratory symptoms. He has his usual productive morning cough but then better during the day. He is compliant with his pulmonary medications and only has to use his nebulizer PRN. He denied any history of prolonged intubation after surgery. He reported evaluation for chest pain several years ago ([facility], but denied any testing. He denied known history of cardiac issues although he does have a family history (father MI/CABG, PAD). He denied any recent chest pain and SOB at rest.  He denied orthopnea and says he can lie flat and watch TV. He denied DOE with activities such as walking around his house, but DOE with activities like walking down the long hospital hallway. He feels like his chronic DOE is stable. He has chronic BLE swelling that is followed by his PCP. He says he was told it was due to "Lyrica". He is now on PRN Lasix and KCL which has helped a bit. PCP notes mention much improvement at follow-up.  He activity is limited due to BLE pain (he says a combination of circulation, gout). He is able to go out and  feed his chickens. He has chronic hoarseness with negative biopsy several years ago--he says he was told he was at risk for oral cancer if he did not quit smoking.  Unfortunately still smoking, but has cut down as mentioned. He reports he is edentulous.  He lives with his mother.   PCP Dr. Dossie Der signed a  medical clearance for surgery with recommendation to continue all current medications as prescribed.  - He denied seeing a pulmonologist or cardiologist.  - He says he was seeing a "Dr. Megan Salon" for Suboxone, but cancelled his 09/04/18 appointment because he is stopping because he is "done with Suboxone!" and feels it does not help his pain after using for ~ 1 1/2 years.  He says marijuana helps him more.   We discussed that he is at increased risk for surgery given his co-morbidities, particularly from a respiratory standpoint. He denied any issues with extubation with prior surgeries and does not have any acute symptoms currently. His last CXR 05/2019 showed no acute disease, and RA O2 sats good at PAT. He has medical clearance from his PCP as well. Although case is posted for general anesthesia, he mentioned that Dr. Jeannie Fend talked with him about potential for what sounds like supraclavicular block and MAC. Hopefully can avoid GETA, but I did discuss that it is still a possibility. I reviewed case with anesthesiologist Annye Asa, MD. Anesthesia team to evaluate on the day of surgery. 08/30/2019 preoperative COVID-19 test is in process.   VS: BP (!) 143/92   Pulse (!) 58   Temp 36.6 C (Oral)   Resp 18   Ht _0  (1.854 m)   Wt 122.2 kg   SpO2 97%   BMI 35.54 kg/m  Lungs clear without wheezes or rhonchi. Heart RRR, no murmur noted. No carotid bruits noted. Up to 1+ pretibial edema.    PROVIDERSLoletta Parish, MD is PCP ([place] in [place]; see Care Everywhere) - Most recent ENT notes are from Marge Duncans, MD ([place]), although he has seen ENT with [facility] as well. According to 11/15/12 office note by Unice Bailey, MD, "patient had seen Dr. Dereck Leep for this complaint [hoarsenss] and was taken to the operating room for panendoscopy, bilateral tonsillectomy, nasopharyngeal biopsy and base of tongue biopsy. All  biopsies were negative and a retraction pocket was noted in the left ear. Since that time, the patient has noted fluctuating hoarseness, which seemingly has been worsening symptomatically over the last 6 months. Per 10/22/15 note, "We also placed him through flexible laryngoscopy today. His vocal folds are somewhat irregular and irritated and the right cord is a little thicker than the left cord. I believe these findings are related to his history of smoking lifelong. I saw nothing worrisome today...". Last office visit 02/01/17.   LABS: Labs reviewed: Acceptable for surgery. (all labs ordered are listed, but only abnormal results are displayed)  Labs Reviewed  GLUCOSE, CAPILLARY - Abnormal; Notable for the following components:      Result Value   Glucose-Capillary 138 (*)    All other components within normal limits  CBC  BASIC METABOLIC PANEL  HEMOGLOBIN A1C     IMAGES: CT right wrist 08/02/19: IMPRESSION: 1. Comminuted impacted intra-articular distal radius fracture with interval worsening distal impaction of the radial shaft on the scaphoid and lunate. 2. Multiple nonunited distal radius fracture fragments surrounding the wrist with no significant interval healing. 3. Medial and dorsal dislocation of the distal  ulna. 4. Mildly displaced nonunited ulnar styloid fracture fragments. 5. Moderate radioulnar joint effusion  CXR 05/31/19 ([facility]): FINDINGS: Lungs: Clear with no infiltrate Heart: Normal heart size. Mediastinal and hilar structures: No mass or adenopathy Bony structures: No significant abnormality. RESULT IMPRESSION: No acute disease.   EKG: EKG 05/31/19 ([facility]): Tracing on chart.  Diagnosis Normal sinus rhythm rate 82 Leftward axis Left anterior fascicular block Abnormal ECG When compared with ECG of 17-Dec-2018 00:12, No significant change was found Lincoln Brigham [phone]) on 11/28/158 1:09:32 PM certifies that he/she has reviewed the ECG tracing and  confirms the independent interpretation is correct.   CV: N/A   Past Medical History:  Diagnosis Date  . Anxiety   . Arthritis    lower back  . Asthma   . Bipolar disorder ([place])   . COPD (chronic obstructive pulmonary disease) ([place])   . Depression   . Diabetes mellitus without complication ([place])    type 2 - no meds, diet controlled  . Dyspnea    occasional with exertion  . GERD (gastroesophageal reflux disease)   . Gout   . Hyperlipidemia   . Hypertension   . Learning disorder    8th grade education, special ed classes, has trouble reading and some writing  . Pneumonia    several times  . Requires supplemental oxygen    mainly at night - 2L via [place] nightly and prn during day  . Seasonal allergies   . Sleep apnea    does not wear CPAP  . Wears dentures   . Wears glasses     Past Surgical History:  Procedure Laterality Date  . cyst removed     right testicle at [place], [place]  . MULTIPLE TOOTH EXTRACTIONS    . PR LAP, VENTRAL HERNIA REPAIR,REDUCIBLE   02/2015  . PR TRANSURETHRAL ELEC-SURG PROSTATECTOM    04/2016  . [place], [place]  . TRANSURETHRAL RESECTION PROSTATE, CYSTOSCOPY, BILATERAL RETROGRADES    04/2016    MEDICATIONS: . acetaminophen (TYLENOL) 325 MG tablet  . albuterol (PROVENTIL) (2.5 MG/3ML) 0.083% nebulizer solution  . allopurinol (ZYLOPRIM) 300 MG tablet  . baclofen (LIORESAL) 10 MG tablet  . budesonide-formoterol (SYMBICORT) 80-4.5 MCG/ACT inhaler  . escitalopram (LEXAPRO) 10 MG tablet  . furosemide (LASIX) 20 MG tablet  . gabapentin (NEURONTIN) 600 MG tablet  . hydrOXYzine (ATARAX/VISTARIL) 10 MG tablet  . lisinopril-hydrochlorothiazide (PRINZIDE,ZESTORETIC) 20-12.5 MG tablet  . loratadine (CLARITIN) 10 MG tablet  . lovastatin (MEVACOR) 10 MG tablet  . meloxicam (MOBIC) 15 MG tablet  . mometasone (NASONEX) 50 MCG/ACT nasal spray  . mupirocin ointment (BACTROBAN) 2 %  . pantoprazole (PROTONIX) 40 MG tablet  . potassium  chloride (KLOR-CON) 10 MEQ tablet  . pregabalin (LYRICA) 200 MG capsule   No current facility-administered medications for this encounter.   Derrill Memo ON 09/02/2019] ceFAZolin (ANCEF) 3 g in dextrose 5 % 50 mL IVPB    Myra Gianotti, PA-C Surgical Short Stay/Anesthesiology [facility] Phone [phone] [facility] Phone [phone] 08/30/2019 4:50 PM

## 2019-08-30 NOTE — Anesthesia Preprocedure Evaluation (Addendum)
Anesthesia Evaluation  Patient identified by MRN, date of birth, ID band Patient awake    Reviewed: Allergy & Precautions, NPO status , Patient's Chart, lab work & pertinent test results  History of Anesthesia Complications Negative for: history of anesthetic complications  Airway Mallampati: III  TM Distance: >3 FB Neck ROM: Full    Dental  (+) Edentulous Lower, Edentulous Upper   Pulmonary asthma , sleep apnea , COPD,  oxygen dependent, Current Smoker and Patient abstained from smoking.,    Pulmonary exam normal        Cardiovascular hypertension, Pt. on medications Normal cardiovascular exam     Neuro/Psych Anxiety Depression Bipolar Disorder negative neurological ROS     GI/Hepatic GERD  Medicated and Controlled,(+)     substance abuse  marijuana use and methamphetamine use,   Endo/Other  diabetes, Type 2  Renal/GU negative Renal ROS  negative genitourinary   Musculoskeletal  (+) Arthritis ,   Abdominal   Peds  Hematology negative hematology ROS (+)   Anesthesia Other Findings Day of surgery medications reviewed with patient.  Reproductive/Obstetrics negative OB ROS                            Anesthesia Physical Anesthesia Plan  ASA: III  Anesthesia Plan: MAC and Regional   Post-op Pain Management:    Induction: Intravenous  PONV Risk Score and Plan: 0 and Treatment may vary due to age or medical condition, Ondansetron, Propofol infusion and Midazolam  Airway Management Planned: Natural Airway and Simple Face Mask  Additional Equipment: None  Intra-op Plan:   Post-operative Plan:   Informed Consent: I have reviewed the patients History and Physical, chart, labs and discussed the procedure including the risks, benefits and alternatives for the proposed anesthesia with the patient or authorized representative who has indicated his/her understanding and acceptance.      Dental advisory given  Plan Discussed with: CRNA  Anesthesia Plan Comments: (See PAT note written 08/30/2019 by Myra Gianotti, PA-C. Smoker. Home O2 2L at night, PRN day. Chronic hoarseness with previous ENT evaluation. Has primary care clearance from Dr. Nelva Bush. Case is posted for general, but reports surgeon discussed potential for regional.)      Anesthesia Quick Evaluation

## 2019-09-02 ENCOUNTER — Other Ambulatory Visit: Payer: Self-pay

## 2019-09-02 ENCOUNTER — Ambulatory Visit (HOSPITAL_COMMUNITY): Payer: Medicaid Other | Admitting: Anesthesiology

## 2019-09-02 ENCOUNTER — Observation Stay (HOSPITAL_COMMUNITY)
Admission: RE | Admit: 2019-09-02 | Discharge: 2019-09-03 | Disposition: A | Discharge status: Home / Self Care | Payer: Medicaid Other | Attending: Orthopaedic Surgery | Admitting: Orthopaedic Surgery

## 2019-09-02 ENCOUNTER — Ambulatory Visit (HOSPITAL_COMMUNITY): Payer: Medicaid Other | Admitting: Vascular Surgery

## 2019-09-02 ENCOUNTER — Encounter (HOSPITAL_COMMUNITY): Payer: Self-pay | Admitting: Orthopaedic Surgery

## 2019-09-02 ENCOUNTER — Encounter (HOSPITAL_COMMUNITY)
Admission: RE | Disposition: A | Discharge status: Home / Self Care | Payer: Self-pay | Source: Home / Self Care | Attending: Orthopaedic Surgery

## 2019-09-02 DIAGNOSIS — M1990 Unspecified osteoarthritis, unspecified site: Secondary | ICD-10-CM | POA: Diagnosis not present

## 2019-09-02 DIAGNOSIS — E119 Type 2 diabetes mellitus without complications: Secondary | ICD-10-CM | POA: Diagnosis not present

## 2019-09-02 DIAGNOSIS — W19XXXA Unspecified fall, initial encounter: Secondary | ICD-10-CM | POA: Insufficient documentation

## 2019-09-02 DIAGNOSIS — S52501P Unspecified fracture of the lower end of right radius, subsequent encounter for closed fracture with malunion: Secondary | ICD-10-CM

## 2019-09-02 DIAGNOSIS — J449 Chronic obstructive pulmonary disease, unspecified: Secondary | ICD-10-CM | POA: Diagnosis not present

## 2019-09-02 DIAGNOSIS — F17210 Nicotine dependence, cigarettes, uncomplicated: Secondary | ICD-10-CM | POA: Diagnosis not present

## 2019-09-02 DIAGNOSIS — Z7951 Long term (current) use of inhaled steroids: Secondary | ICD-10-CM | POA: Diagnosis not present

## 2019-09-02 DIAGNOSIS — I10 Essential (primary) hypertension: Secondary | ICD-10-CM | POA: Diagnosis not present

## 2019-09-02 DIAGNOSIS — Z9981 Dependence on supplemental oxygen: Secondary | ICD-10-CM | POA: Diagnosis not present

## 2019-09-02 DIAGNOSIS — G4730 Sleep apnea, unspecified: Secondary | ICD-10-CM | POA: Diagnosis not present

## 2019-09-02 DIAGNOSIS — J42 Unspecified chronic bronchitis: Secondary | ICD-10-CM

## 2019-09-02 DIAGNOSIS — E669 Obesity, unspecified: Secondary | ICD-10-CM | POA: Diagnosis not present

## 2019-09-02 DIAGNOSIS — F419 Anxiety disorder, unspecified: Secondary | ICD-10-CM | POA: Diagnosis not present

## 2019-09-02 DIAGNOSIS — F319 Bipolar disorder, unspecified: Secondary | ICD-10-CM | POA: Diagnosis not present

## 2019-09-02 DIAGNOSIS — M109 Gout, unspecified: Secondary | ICD-10-CM | POA: Diagnosis not present

## 2019-09-02 DIAGNOSIS — Z6834 Body mass index (BMI) 34.0-34.9, adult: Secondary | ICD-10-CM | POA: Diagnosis not present

## 2019-09-02 DIAGNOSIS — Z791 Long term (current) use of non-steroidal anti-inflammatories (NSAID): Secondary | ICD-10-CM | POA: Insufficient documentation

## 2019-09-02 DIAGNOSIS — M545 Low back pain, unspecified: Secondary | ICD-10-CM | POA: Diagnosis present

## 2019-09-02 DIAGNOSIS — E1169 Type 2 diabetes mellitus with other specified complication: Secondary | ICD-10-CM

## 2019-09-02 DIAGNOSIS — Z79899 Other long term (current) drug therapy: Secondary | ICD-10-CM | POA: Insufficient documentation

## 2019-09-02 DIAGNOSIS — K219 Gastro-esophageal reflux disease without esophagitis: Secondary | ICD-10-CM | POA: Insufficient documentation

## 2019-09-02 DIAGNOSIS — S52501A Unspecified fracture of the lower end of right radius, initial encounter for closed fracture: Secondary | ICD-10-CM | POA: Diagnosis present

## 2019-09-02 DIAGNOSIS — E785 Hyperlipidemia, unspecified: Secondary | ICD-10-CM | POA: Diagnosis not present

## 2019-09-02 HISTORY — PX: WRIST FUSION: SHX839

## 2019-09-02 LAB — GLUCOSE, CAPILLARY
Glucose-Capillary: 106 mg/dL — ABNORMAL HIGH (ref 70–99)
Glucose-Capillary: 106 mg/dL — ABNORMAL HIGH (ref 70–99)

## 2019-09-02 SURGERY — FUSION, JOINT, WRIST
Anesthesia: Monitor Anesthesia Care | Site: Wrist | Laterality: Right

## 2019-09-02 MED ORDER — HEMOSTATIC AGENTS (NO CHARGE) OPTIME
TOPICAL | Status: DC | PRN
  Administered 2019-09-02: 1

## 2019-09-02 MED ORDER — THROMBIN 5000 UNITS EX SOLR
CUTANEOUS | Status: AC
  Filled 2019-09-02: qty 5000

## 2019-09-02 MED ORDER — PROPOFOL 500 MG/50ML IV EMUL
INTRAVENOUS | Status: DC | PRN
  Administered 2019-09-02: 25 ug/kg/min via INTRAVENOUS

## 2019-09-02 MED ORDER — LIDOCAINE 2% (20 MG/ML) 5 ML SYRINGE
INTRAMUSCULAR | Status: AC
  Filled 2019-09-02: qty 5

## 2019-09-02 MED ORDER — MOMETASONE FURO-FORMOTEROL FUM 100-5 MCG/ACT IN AERO
2.0000 | INHALATION_SPRAY | Freq: Two times a day (BID) | RESPIRATORY_TRACT | Status: DC
  Administered 2019-09-02 – 2019-09-03 (×2): 2 via RESPIRATORY_TRACT
  Filled 2019-09-02: qty 8.8

## 2019-09-02 MED ORDER — MIDAZOLAM HCL 2 MG/2ML IJ SOLN
INTRAMUSCULAR | Status: AC
  Filled 2019-09-02: qty 2

## 2019-09-02 MED ORDER — FLUTICASONE PROPIONATE 50 MCG/ACT NA SUSP
2.0000 | Freq: Every day | NASAL | Status: DC
  Administered 2019-09-03: 2 via NASAL
  Filled 2019-09-02: qty 16

## 2019-09-02 MED ORDER — MORPHINE SULFATE (PF) 2 MG/ML IV SOLN
0.5000 mg | INTRAVENOUS | Status: DC | PRN
  Administered 2019-09-02: 1 mg via INTRAVENOUS
  Filled 2019-09-02: qty 1

## 2019-09-02 MED ORDER — PROPOFOL 10 MG/ML IV BOLUS
INTRAVENOUS | Status: DC | PRN
  Administered 2019-09-02: 25 mg via INTRAVENOUS
  Administered 2019-09-02: 20 mg via INTRAVENOUS

## 2019-09-02 MED ORDER — ONDANSETRON HCL 4 MG/2ML IJ SOLN
INTRAMUSCULAR | Status: AC
  Filled 2019-09-02: qty 2

## 2019-09-02 MED ORDER — ONDANSETRON HCL 4 MG/2ML IJ SOLN
INTRAMUSCULAR | Status: DC | PRN
  Administered 2019-09-02: 4 mg via INTRAVENOUS

## 2019-09-02 MED ORDER — PRAVASTATIN SODIUM 10 MG PO TABS
10.0000 mg | ORAL_TABLET | Freq: Every day | ORAL | Status: DC
  Administered 2019-09-02 – 2019-09-03 (×2): 10 mg via ORAL
  Filled 2019-09-02 (×2): qty 1

## 2019-09-02 MED ORDER — BUPIVACAINE-EPINEPHRINE (PF) 0.5% -1:200000 IJ SOLN
INTRAMUSCULAR | Status: DC | PRN
  Administered 2019-09-02: 30 mL via PERINEURAL

## 2019-09-02 MED ORDER — FENTANYL CITRATE (PF) 250 MCG/5ML IJ SOLN
INTRAMUSCULAR | Status: AC
  Filled 2019-09-02: qty 5

## 2019-09-02 MED ORDER — ONDANSETRON HCL 4 MG PO TABS: 4.0000 mg | ORAL_TABLET | Freq: Four times a day (QID) | ORAL | Status: DC | PRN

## 2019-09-02 MED ORDER — PANTOPRAZOLE SODIUM 40 MG PO TBEC
40.0000 mg | DELAYED_RELEASE_TABLET | Freq: Every day | ORAL | Status: DC
  Administered 2019-09-03: 40 mg via ORAL
  Filled 2019-09-02: qty 1

## 2019-09-02 MED ORDER — THROMBIN 20000 UNITS EX SOLR
CUTANEOUS | Status: DC | PRN
  Administered 2019-09-02: 5000 [IU] via TOPICAL

## 2019-09-02 MED ORDER — ACETAMINOPHEN 500 MG PO TABS
1000.0000 mg | ORAL_TABLET | Freq: Once | ORAL | Status: AC
  Administered 2019-09-02: 1000 mg via ORAL
  Filled 2019-09-02: qty 2

## 2019-09-02 MED ORDER — 0.9 % SODIUM CHLORIDE (POUR BTL) OPTIME
TOPICAL | Status: DC | PRN
  Administered 2019-09-02 (×2): 1000 mL

## 2019-09-02 MED ORDER — BACLOFEN 10 MG PO TABS
10.0000 mg | ORAL_TABLET | Freq: Three times a day (TID) | ORAL | Status: DC
  Administered 2019-09-02 – 2019-09-03 (×4): 10 mg via ORAL
  Filled 2019-09-02 (×4): qty 1

## 2019-09-02 MED ORDER — ACETAMINOPHEN 325 MG PO TABS: 325.0000 mg | ORAL_TABLET | Freq: Four times a day (QID) | ORAL | Status: DC | PRN

## 2019-09-02 MED ORDER — LACTATED RINGERS IV SOLN: INTRAVENOUS | Status: DC | PRN

## 2019-09-02 MED ORDER — ALLOPURINOL 300 MG PO TABS
300.0000 mg | ORAL_TABLET | Freq: Every day | ORAL | Status: DC
  Administered 2019-09-03: 300 mg via ORAL
  Filled 2019-09-02: qty 1

## 2019-09-02 MED ORDER — ESCITALOPRAM OXALATE 10 MG PO TABS
10.0000 mg | ORAL_TABLET | Freq: Every day | ORAL | Status: DC
  Administered 2019-09-03: 10 mg via ORAL
  Filled 2019-09-02: qty 1

## 2019-09-02 MED ORDER — OXYCODONE HCL 5 MG PO TABS: 5.0000 mg | ORAL_TABLET | Freq: Once | ORAL | Status: DC | PRN

## 2019-09-02 MED ORDER — HYDROCHLOROTHIAZIDE 12.5 MG PO CAPS
12.5000 mg | ORAL_CAPSULE | Freq: Every day | ORAL | Status: DC
  Administered 2019-09-02 – 2019-09-03 (×2): 12.5 mg via ORAL
  Filled 2019-09-02 (×2): qty 1

## 2019-09-02 MED ORDER — DOCUSATE SODIUM 100 MG PO CAPS
100.0000 mg | ORAL_CAPSULE | Freq: Two times a day (BID) | ORAL | Status: DC
  Administered 2019-09-02 – 2019-09-03 (×2): 100 mg via ORAL
  Filled 2019-09-02 (×2): qty 1

## 2019-09-02 MED ORDER — ONDANSETRON HCL 4 MG/2ML IJ SOLN: 4.0000 mg | Freq: Four times a day (QID) | INTRAMUSCULAR | Status: DC | PRN

## 2019-09-02 MED ORDER — LISINOPRIL 20 MG PO TABS
20.0000 mg | ORAL_TABLET | Freq: Every day | ORAL | Status: DC
  Administered 2019-09-02 – 2019-09-03 (×2): 20 mg via ORAL
  Filled 2019-09-02 (×2): qty 1

## 2019-09-02 MED ORDER — LORATADINE 10 MG PO TABS
10.0000 mg | ORAL_TABLET | Freq: Every day | ORAL | Status: DC
  Administered 2019-09-03: 10 mg via ORAL
  Filled 2019-09-02: qty 1

## 2019-09-02 MED ORDER — LIDOCAINE HCL (PF) 1 % IJ SOLN
INTRAMUSCULAR | Status: AC
  Filled 2019-09-02: qty 30

## 2019-09-02 MED ORDER — METHOCARBAMOL 500 MG PO TABS
500.0000 mg | ORAL_TABLET | Freq: Four times a day (QID) | ORAL | Status: DC | PRN
  Administered 2019-09-02 – 2019-09-03 (×3): 500 mg via ORAL
  Filled 2019-09-02 (×3): qty 1

## 2019-09-02 MED ORDER — PREGABALIN 100 MG PO CAPS
200.0000 mg | ORAL_CAPSULE | Freq: Three times a day (TID) | ORAL | Status: DC
  Administered 2019-09-02 – 2019-09-03 (×4): 200 mg via ORAL
  Filled 2019-09-02 (×4): qty 2

## 2019-09-02 MED ORDER — OXYCODONE HCL 5 MG/5ML PO SOLN: 5.0000 mg | Freq: Once | ORAL | Status: DC | PRN

## 2019-09-02 MED ORDER — ALBUTEROL SULFATE (2.5 MG/3ML) 0.083% IN NEBU
2.5000 mg | INHALATION_SOLUTION | Freq: Four times a day (QID) | RESPIRATORY_TRACT | Status: DC | PRN
  Administered 2019-09-02: 2.5 mg via RESPIRATORY_TRACT
  Filled 2019-09-02: qty 3

## 2019-09-02 MED ORDER — PROMETHAZINE HCL 25 MG/ML IJ SOLN
INTRAMUSCULAR | Status: AC
  Filled 2019-09-02: qty 1

## 2019-09-02 MED ORDER — HYDROCODONE-ACETAMINOPHEN 7.5-325 MG PO TABS
1.0000 | ORAL_TABLET | ORAL | Status: DC | PRN
  Administered 2019-09-02: 1 via ORAL
  Administered 2019-09-03 (×5): 2 via ORAL
  Filled 2019-09-02 (×2): qty 2
  Filled 2019-09-02: qty 1
  Filled 2019-09-02 (×3): qty 2

## 2019-09-02 MED ORDER — PROMETHAZINE HCL 25 MG/ML IJ SOLN
6.2500 mg | INTRAMUSCULAR | Status: DC | PRN
  Administered 2019-09-02: 6.25 mg via INTRAVENOUS

## 2019-09-02 MED ORDER — CEFAZOLIN SODIUM-DEXTROSE 2-4 GM/100ML-% IV SOLN
2.0000 g | Freq: Three times a day (TID) | INTRAVENOUS | Status: AC
  Administered 2019-09-02 – 2019-09-03 (×3): 2 g via INTRAVENOUS
  Filled 2019-09-02 (×3): qty 100

## 2019-09-02 MED ORDER — MIDAZOLAM HCL 2 MG/2ML IJ SOLN
INTRAMUSCULAR | Status: DC | PRN
  Administered 2019-09-02: 2 mg via INTRAVENOUS

## 2019-09-02 MED ORDER — HYDROCODONE-ACETAMINOPHEN 5-325 MG PO TABS: 1.0000 | ORAL_TABLET | ORAL | Status: DC | PRN

## 2019-09-02 MED ORDER — LISINOPRIL-HYDROCHLOROTHIAZIDE 20-12.5 MG PO TABS: 1.0000 | ORAL_TABLET | Freq: Every day | ORAL | Status: DC

## 2019-09-02 MED ORDER — METHOCARBAMOL 1000 MG/10ML IJ SOLN
500.0000 mg | Freq: Four times a day (QID) | INTRAVENOUS | Status: DC | PRN
  Filled 2019-09-02: qty 5

## 2019-09-02 MED ORDER — HYDROXYZINE HCL 10 MG PO TABS
10.0000 mg | ORAL_TABLET | Freq: Three times a day (TID) | ORAL | Status: DC | PRN
  Filled 2019-09-02: qty 1

## 2019-09-02 MED ORDER — CHLORHEXIDINE GLUCONATE 4 % EX LIQD: 60.0000 mL | Freq: Once | CUTANEOUS | Status: DC

## 2019-09-02 MED ORDER — FENTANYL CITRATE (PF) 100 MCG/2ML IJ SOLN: 25.0000 ug | INTRAMUSCULAR | Status: DC | PRN

## 2019-09-02 SURGICAL SUPPLY — 81 items
BIT DRILL 2.8 QUICK RELEASE (BIT) ×1 IMPLANT
BIT DRILL 2X3.5 HAND QK RELEAS (BIT) ×1 IMPLANT
BIT DRILL 2X3.5 QUICK RELEASE (BIT) ×3
BLADE AVERAGE 25MMX9MM (BLADE) ×1
BLADE AVERAGE 25X9 (BLADE) ×2 IMPLANT
BLADE SURG 15 STRL LF DISP TIS (BLADE) ×6 IMPLANT
BLADE SURG 15 STRL SS (BLADE) ×12
BNDG ELASTIC 3X5.8 VLCR STR LF (GAUZE/BANDAGES/DRESSINGS) IMPLANT
BNDG ELASTIC 4X5.8 VLCR STR LF (GAUZE/BANDAGES/DRESSINGS) ×3 IMPLANT
BNDG ESMARK 4X9 LF (GAUZE/BANDAGES/DRESSINGS) ×3 IMPLANT
BNDG GAUZE ELAST 4 BULKY (GAUZE/BANDAGES/DRESSINGS) IMPLANT
BONE VIVIGEN FORMABLE 1.3CC (Bone Implant) ×3 IMPLANT
CLOSURE WOUND 1/2 X4 (GAUZE/BANDAGES/DRESSINGS)
CORD BIPOLAR FORCEPS 12FT (ELECTRODE) ×3 IMPLANT
COVER SURGICAL LIGHT HANDLE (MISCELLANEOUS) ×3 IMPLANT
COVER WAND RF STERILE (DRAPES) ×3 IMPLANT
CUFF TOURN SGL QUICK 18X4 (TOURNIQUET CUFF) IMPLANT
CUFF TOURN SGL QUICK 24 (TOURNIQUET CUFF) ×2
CUFF TRNQT CYL 24X4X16.5-23 (TOURNIQUET CUFF) ×1 IMPLANT
DRAPE OEC MINIVIEW 54X84 (DRAPES) ×3 IMPLANT
DRAPE SURG 17X23 STRL (DRAPES) ×3 IMPLANT
DRILL 2.8 QUICK RELEASE (BIT) ×3
DURAPREP 26ML APPLICATOR (WOUND CARE) ×3 IMPLANT
EVACUATOR 1/8 PVC DRAIN (DRAIN) ×3 IMPLANT
GAUZE SPONGE 4X4 12PLY STRL (GAUZE/BANDAGES/DRESSINGS) ×3 IMPLANT
GAUZE XEROFORM 1X8 LF (GAUZE/BANDAGES/DRESSINGS) IMPLANT
GAUZE XEROFORM 5X9 LF (GAUZE/BANDAGES/DRESSINGS) ×3 IMPLANT
GLOVE SURG SYN 8.0 (GLOVE) ×3 IMPLANT
GOWN STRL REUS W/ TWL LRG LVL3 (GOWN DISPOSABLE) ×1 IMPLANT
GOWN STRL REUS W/ TWL XL LVL3 (GOWN DISPOSABLE) ×1 IMPLANT
GOWN STRL REUS W/TWL LRG LVL3 (GOWN DISPOSABLE) ×2
GOWN STRL REUS W/TWL XL LVL3 (GOWN DISPOSABLE) ×2
KIT BASIN OR (CUSTOM PROCEDURE TRAY) ×3 IMPLANT
KIT TURNOVER KIT B (KITS) ×3 IMPLANT
LOOP VESSEL MAXI BLUE (MISCELLANEOUS) IMPLANT
MANIFOLD NEPTUNE II (INSTRUMENTS) ×3 IMPLANT
NEEDLE HYPO 25GX1X1/2 BEV (NEEDLE) IMPLANT
NS IRRIG 1000ML POUR BTL (IV SOLUTION) ×3 IMPLANT
PACK ORTHO EXTREMITY (CUSTOM PROCEDURE TRAY) ×3 IMPLANT
PAD ARMBOARD 7.5X6 YLW CONV (MISCELLANEOUS) ×6 IMPLANT
PAD CAST 3X4 CTTN HI CHSV (CAST SUPPLIES) IMPLANT
PAD CAST 4YDX4 CTTN HI CHSV (CAST SUPPLIES) ×1 IMPLANT
PADDING CAST COTTON 3X4 STRL (CAST SUPPLIES)
PADDING CAST COTTON 4X4 STRL (CAST SUPPLIES) ×2
PLATE TOTAL WRIST FUSION (Plate) ×3 IMPLANT
SCREW BONE LOCK 2.3X14MM HEXA (Screw) ×1 IMPLANT
SCREW HEXALOBE LOCKING 3.5X14M (Screw) ×3 IMPLANT
SCREW HEXALOBE LOCKING 3.5X16M (Screw) ×3 IMPLANT
SCREW HEXALOBE NON-LOCK 3.5X14 (Screw) ×6 IMPLANT
SCREW HEXALOBE NON-LOCK 3.5X16 (Screw) ×3 IMPLANT
SCREW LOCK 12X3.5X HEXALOBE (Screw) ×1 IMPLANT
SCREW LOCK 2.3X14 (Screw) ×3 IMPLANT
SCREW LOCK HEX MULTI 2.3X10 (Screw) ×3 IMPLANT
SCREW LOCK HEX MULTI 2.3X12 (Screw) ×3 IMPLANT
SCREW LOCK HEX MULTI 2.3X16 (Screw) ×3 IMPLANT
SCREW LOCKING 3.5X12 (Screw) ×2 IMPLANT
SLING ARM IMMOBILIZER XL (CAST SUPPLIES) ×3 IMPLANT
SPLINT FIBERGLASS 3X35 (CAST SUPPLIES) ×3 IMPLANT
SPONGE LAP 18X18 RF (DISPOSABLE) ×3 IMPLANT
SPONGE SURGIFOAM ABS GEL SZ50 (HEMOSTASIS) ×3 IMPLANT
STRIP CLOSURE SKIN 1/2X4 (GAUZE/BANDAGES/DRESSINGS) IMPLANT
SUCTION FRAZIER HANDLE 10FR (MISCELLANEOUS) ×2
SUCTION TUBE FRAZIER 10FR DISP (MISCELLANEOUS) ×1 IMPLANT
SUT ETHIBOND 3-0 V-5 (SUTURE) IMPLANT
SUT ETHILON 5 0 PS 2 18 (SUTURE) IMPLANT
SUT MERSILENE 4 0 P 3 (SUTURE) IMPLANT
SUT PROLENE 3 0 PS 2 (SUTURE) IMPLANT
SUT PROLENE 4 0 PS 2 18 (SUTURE) ×12 IMPLANT
SUT VIC AB 3-0 FS2 27 (SUTURE) IMPLANT
SUT VIC AB 3-0 SH 27 (SUTURE) ×4
SUT VIC AB 3-0 SH 27X BRD (SUTURE) ×2 IMPLANT
SUT VIC AB 4-0 P-3 18X BRD (SUTURE) IMPLANT
SUT VIC AB 4-0 P3 18 (SUTURE)
SYR CONTROL 10ML LL (SYRINGE) ×3 IMPLANT
TOWEL GREEN STERILE (TOWEL DISPOSABLE) ×3 IMPLANT
TOWEL GREEN STERILE FF (TOWEL DISPOSABLE) ×3 IMPLANT
TUBE CONNECTING 12'X1/4 (SUCTIONS) ×1
TUBE CONNECTING 12X1/4 (SUCTIONS) ×2 IMPLANT
UNDERPAD 30X30 (UNDERPADS AND DIAPERS) ×3 IMPLANT
WATER STERILE IRR 1000ML POUR (IV SOLUTION) ×3 IMPLANT
WIRE TACK PLATE PL-PTACK (Wire) ×6 IMPLANT

## 2019-09-02 NOTE — H&P (Signed)
ORTHOPAEDIC H&P  PCP:  Karle Plumber, MD  Chief Complaint: Right distal radius nonunion  HPI: Dustin Vaughn is a 54 y.o. male who complains of a right distal radius nonunion which is displaced.  He had an injury several months ago initially and at the time this was treated nonoperatively.  He went on to have a secondary fall again and was subsequently seen in our ER here.  He was found to have a displaced, shortened chronic nonunion of the right distal radius.  He had severe degenerative changes of the lunate which was articulating on the radial shaft.  I had a long discussion with him regarding treatment options.  These options included continued nonoperative management or proceeding for surgery.  Surgery would require a total wrist arthrodesis, distal ulna resection and PIN neurectomy.  After discussing the risks of this in the clinic with him he did wish to proceed forward.  He was optimized medically and cleared by his PCP.  Additionally we had a preoperative bone stimulator ordered for him to aid in his recovery postop.  He presents today for surgery.  Past Medical History:  Diagnosis Date  . Anxiety   . Arthritis    lower back  . Asthma   . Bipolar disorder (H[facility])   . COPD (chronic obstructive pulmonary disease) ([facility])   . Depression   . Diabetes mellitus without complication ([facility])    type 2 - no meds, diet controlled  . Dyspnea    occasional with exertion  . GERD (gastroesophageal reflux disease)   . Gout   . Hoarseness   . Hyperlipidemia   . Hypertension   . Learning disorder    8th grade education, special ed classes, has trouble reading and some writing  . Pneumonia    several times  . Requires supplemental oxygen    mainly at night - 2L via [place] nightly and prn during day  . Seasonal allergies   . Sleep apnea    does not wear CPAP  . Wears dentures   . Wears glasses    Past Surgical History:  Procedure Laterality Date  . cyst removed     right testicle at  [place], [place]  . MULTIPLE TOOTH EXTRACTIONS    . PR LAP, VENTRAL HERNIA REPAIR,REDUCIBLE   02/2015  . PR TRANSURETHRAL ELEC-SURG PROSTATECTOM    04/2016  . TONSILLECTOMY     [place], [place]  . TRANSURETHRAL RESECTION PROSTATE, CYSTOSCOPY, BILATERAL RETROGRADES    04/2016   Social History   Socioeconomic History  . Marital status: Single    Spouse name: Not on file  . Number of children: Not on file  . Years of education: Not on file  . Highest education level: Not on file  Occupational History  . Not on file  Tobacco Use  . Smoking status: Current Every Day Smoker    Packs/day: 0.50    Years: 49.00    Pack years: 24.50    Types: Cigarettes  . Smokeless tobacco: Never Used  . Tobacco comment: smoking since age 74 yr per patient 08/30/19  Substance and Sexual Activity  . Alcohol use: Not on file  . Drug use: Yes    Types: Marijuana, Methamphetamines    Comment: Last use 08/30/19 marijuana, last use methamphetamines 02/2020  . Sexual activity: Not Currently    Birth control/protection: None  Other Topics Concern  . Not on file  Social History Narrative  . Not on file   Social  Determinants of Health   Financial Resource Strain:   . Difficulty of Paying Living Expenses: Not on file  Food Insecurity:   . Worried About Programme researcher, broadcasting/film/video in the Last Year: Not on file  . Ran Out of Food in the Last Year: Not on file  Transportation Needs:   . Lack of Transportation (Medical): Not on file  . Lack of Transportation (Non-Medical): Not on file  Physical Activity:   . Days of Exercise per Week: Not on file  . Minutes of Exercise per Session: Not on file  Stress:   . Feeling of Stress : Not on file  Social Connections:   . Frequency of Communication with Friends and Family: Not on file  . Frequency of Social Gatherings with Friends and Family: Not on file  . Attends Religious Services: Not on file  . Active Member of Clubs or Organizations: Not on file  . Attends Tax inspector Meetings: Not on file  . Marital Status: Not on file   History reviewed. No pertinent family history. Allergies  Allergen Reactions  . Trazodone Hcl Other (See Comments)    Syncope   Prior to Admission medications   Medication Sig Start Date End Date Taking? Authorizing Provider  albuterol (PROVENTIL) (2.5 MG/3ML) 0.083% nebulizer solution Take 2.5 mg by nebulization every 6 (six) hours as needed for wheezing or shortness of breath.   Yes [provider]  allopurinol (ZYLOPRIM) 300 MG tablet Take 300 mg by mouth daily.   Yes [provider]  baclofen (LIORESAL) 10 MG tablet Take 10 mg by mouth 3 (three) times daily.   Yes [provider]  budesonide-formoterol (SYMBICORT) 80-4.5 MCG/ACT inhaler Inhale 2 puffs into the lungs 2 (two) times daily.   Yes [provider]  escitalopram (LEXAPRO) 10 MG tablet Take 10 mg by mouth daily. 08/03/19  Yes [provider]  furosemide (LASIX) 20 MG tablet Take 20 mg by mouth 2 (two) times daily as needed for edema. 08/16/19  Yes [provider]  gabapentin (NEURONTIN) 600 MG tablet Take 600 mg by mouth 3 (three) times daily. For 3 days   Yes [provider]  hydrOXYzine (ATARAX/VISTARIL) 10 MG tablet Take 10 mg by mouth in the morning, at noon, and at bedtime.   Yes [provider]  lisinopril-hydrochlorothiazide (PRINZIDE,ZESTORETIC) 20-12.5 MG tablet Take 1 tablet by mouth daily.   Yes [provider]  loratadine (CLARITIN) 10 MG tablet Take 10 mg by mouth daily.   Yes [provider]  lovastatin (MEVACOR) 10 MG tablet Take 10 mg by mouth at bedtime.   Yes [provider]  meloxicam (MOBIC) 15 MG tablet Take 15 mg by mouth daily.   Yes [provider]  mometasone (NASONEX) 50 MCG/ACT nasal spray Place 2 sprays into the nose daily as needed (allergies).    Yes [provider]  mupirocin ointment (BACTROBAN) 2 % Apply 1 application  topically daily as needed for wound care. 07/16/19  Yes [provider]  pantoprazole (PROTONIX) 40 MG tablet Take 40 mg by mouth daily.   Yes [provider]  potassium chloride (KLOR-CON) 10 MEQ tablet Take 10 mEq by mouth 2 (two) times daily as needed (when taking lasix).  08/16/19  Yes [provider]  pregabalin (LYRICA) 200 MG capsule Take 200 mg by mouth in the morning, at noon, and at bedtime.    Yes [provider]  acetaminophen (TYLENOL) 325 MG tablet Take 650 mg  by mouth 3 (three) times daily as needed for moderate pain.    [provider]   No results found.  Positive ROS: All other systems have been reviewed and were otherwise negative with the exception of those mentioned in the HPI and as above.  Physical Exam: General: Alert, no acute distress Cardiovascular: No pedal edema Respiratory: No cyanosis, no use of accessory musculature Skin: No lesions in the area of chief complaint Psychiatric: Patient is competent for consent with normal mood and affect Lymphatic: No axillary or cervical lymphadenopathy  MUSCULOSKELETAL:  Examination of the right wrist shows a severe deformity of the wrist. Clinical photos were attached to the chart below. There is a significant prominence of the distal ulna as well as palmar and radial shift of the hand and compared to the forearm. There is thinning of the skin over the distal ulna but no open wounds or skin breakdown is noted. He has severe tenderness to palpation both palmarly and dorsally about both the radial and ulnar aspects of the wrist. He has significant pain with attempted wrist flexion, extension, pronation and supination, all of which are significantly limited. He does have intact digit flexion and extension, though this is also limited secondary to pain. His fingertips are warm and well perfused with brisk capillary refill. His sensation is grossly intact to light touch throughout all  digits.  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  Risks of surgery were once again discussed with the patient.  These include but are not limited to infection, bleeding, damage to surrounding structures including blood vessels and nerves, pain, stiffness, malunion, nonunion, implant failure need for additional procedures.  Informed consent was obtained the patient's right arm was marked.  Plan for admission overnight for observation with anticipated discharge tomorrow morning.    Verner Mould, MD [phone]   09/02/2019 7:10 AM

## 2019-09-02 NOTE — Anesthesia Postprocedure Evaluation (Signed)
Anesthesia Post Note  Patient: Dustin Vaughn  Procedure(s) Performed: Right total wrist arthrodesis with local or bone graft, distal ulna excision, PIN neurectomy (Right Wrist)     Patient location during evaluation: PACU Anesthesia Type: Regional Level of consciousness: awake and alert and oriented Pain management: pain level controlled Vital Signs Assessment: post-procedure vital signs reviewed and stable Respiratory status: spontaneous breathing, nonlabored ventilation and respiratory function stable Cardiovascular status: blood pressure returned to baseline Postop Assessment: no apparent nausea or vomiting Anesthetic complications: no    Last Vitals:  Vitals:   09/02/19 1105 09/02/19 1120  BP: 130/75 110/74  Pulse: (!) 59 (!) 52  Resp: 12 14  Temp:    SpO2: 95% 98%    Last Pain:  Vitals:   09/02/19 1120  TempSrc:   PainSc: 0-No pain                 Kaylyn Layer

## 2019-09-02 NOTE — Consult Note (Signed)
Medical Consultation   Dustin Vaughn  YQI:[id number]  DOB: 1965-10-02  DOA: 09/02/2019  PCP: Karle Plumber, MD   Requesting physician: Roney Mans, orthopedics  Reason for consultation: Distal radius fracture, not treated since September.  Had fusion today.  Requests medical management consult.   History of Present Illness: Dustin Vaughn is an 54 y.o. 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  He reports that he has been having ongoing R arm pain as well as various other joint pains prior to admission but otherwise is without complaints.  Specifically denies CP, SOB, GI issues, etc.  He reports general compliance with meds, missing maybe one dose per week of medications.  He is not on CPAP but does wear qhs O2.  He has required 2L [place] O2 in PACU to maintain sats, since he was consistently 88% on RA.    Review of Systems:  ROS As per HPI otherwise 10 point review of systems negative.    Past Medical History: Past Medical History:  Diagnosis Date  . Anxiety   . Arthritis    lower back  . Asthma   . Bipolar disorder (HCC)   . COPD (chronic obstructive pulmonary disease) (HCC)   . Depression   . Diabetes mellitus without complication (HCC)    type 2 - no meds, diet controlled  . Dyspnea    occasional with exertion  . GERD (gastroesophageal reflux disease)   . Gout   . Hoarseness   . Hyperlipidemia   . Hypertension   . Learning disorder    8th grade education, special ed classes, has trouble reading and some writing  . Pneumonia    several times  . Requires supplemental oxygen    mainly at night - 2L via [place] nightly and prn during day  . Seasonal allergies   . Sleep apnea    does not wear CPAP  . Wears dentures   . Wears glasses     Past Surgical History: Past Surgical History:  Procedure Laterality Date  . cyst removed     right testicle at [facility], [place]  . MULTIPLE TOOTH EXTRACTIONS    . PR LAP, VENTRAL HERNIA REPAIR,REDUCIBLE   02/2015  . PR TRANSURETHRAL ELEC-SURG PROSTATECTOM    04/2016  . TONSILLECTOMY     [facility], [place]  . TRANSURETHRAL RESECTION PROSTATE, CYSTOSCOPY, BILATERAL RETROGRADES    04/2016     Allergies:   Allergies  Allergen Reactions  . Trazodone Hcl Other (See Comments)    Syncope     Social History:  reports that he has been smoking cigarettes. He has a 24.50 pack-year smoking history. He has never used smokeless tobacco. He reports current drug use. Drugs: Marijuana and Methamphetamines. No history on file for alcohol.   Family History: History reviewed. No pertinent family history.    Physical Exam: Vitals:   09/02/19 1204 09/02/19 1235 09/02/19 1300 09/02/19 1338  BP: (!) 103/55 (!) 98/56 114/69 116/83  Pulse: (!) 54 (!) 53 (!) 52 (!) 56  Resp: 16 13 13 17   Temp:      TempSrc:      SpO2: 95% 94% 95% 94%  Weight:      Height:        Constitutional: Alert and awake, oriented x3, not in any  acute distress. Eyes:  EOMI, irises appear normal, anicteric sclera  ENMT: external ears and nose appear normal, normal hearing, Lips appear normal, oropharynx mucosa, tongue appear normal  Neck: neck appears normal, no masses, normal ROM, no thyromegaly, no JVD  CVS: S1-S2 clear, no murmur rubs or gallops, no LE edema, normal pedal pulses  Respiratory:  clear to auscultation bilaterally, no wheezing, rales or rhonchi. Respiratory effort normal. No accessory muscle use.  Abdomen: soft nontender, nondistended Musculoskeletal: : no cyanosis, clubbing or edema noted bilaterally; R arm is wrapped in a splint with a drain in place and the drain has put out about 25 cc serous drainage so far Neuro: Cranial nerves II-XII intact, strength, sensation, reflexes Psych: judgement and insight appear normal, stable mood and affect, mental status Skin: no rashes or lesions or ulcers, no induration or nodules     Data reviewed:  I have personally reviewed the recent labs and imaging studies  Pertinent Labs:   Glucose 106 Unremarkable BMP and CBC on 3/5 A1c 5.3 on 3/5 COVID negative on 3/5   Inpatient Medications:   Scheduled Meds: . allopurinol  300 mg Oral Daily  . baclofen  10 mg Oral TID  . chlorhexidine  60 mL Topical Once  . docusate sodium  100 mg Oral BID  . escitalopram  10 mg Oral Daily  . fluticasone  2 spray Each Nare Daily  . lisinopril-hydrochlorothiazide  1 tablet Oral Daily  . loratadine  10 mg Oral Daily  . mometasone-formoterol  2 puff Inhalation BID  . pantoprazole  40 mg Oral Daily  . pravastatin  10 mg Oral q1800  . pregabalin  200 mg Oral TID  . promethazine       Continuous Infusions: .  ceFAZolin (ANCEF) IV    . methocarbamol (ROBAXIN) IV       Radiological Exams on Admission: No results found.  Impression/Recommendations Principal Problem:   Closed fracture of right distal radius Active Problems:   Chronic obstructive pulmonary disease (HCC)   Diabetes mellitus (HCC)   Low back pain   Apnea, sleep   Essential hypertension   Dyslipidemia   Obesity (BMI 30.0-34.9)   R radius fracture -Chronic nonunion R radius fracture from fall in September -Complicated repair today -Management per orthopedics  COPD -Continue albuterol, Symbicort -Wears home O2 qhs  Obesity -BMI 34 -Weight loss should be encouraged  HTN -Continue Zestoretic  HLD -Continue Mevacor  DM -Diet controlled -Recent A1c was 5.3, indicating good control  OSA -He does not wear CPAP, continue qhs O2  Back pain -Continue Baclofen, Mobic, Lyrica  Anxiety -Continue Lexapro, hydroxyzine    Thank you for this consultation.  Our [facility] hospitalist team will sign off at this time, as it appears that the patient is very well managed by Dr. Jeannie Fend at this time and is likely to d/c to home tomorrow.    Time Spent: 50 minutes  Karmen Bongo M.D. [facility] 09/02/2019, 2:00 PM

## 2019-09-02 NOTE — Transfer of Care (Signed)
Immediate Anesthesia Transfer of Care Note  Patient: Dustin Vaughn  Procedure(s) Performed: Right total wrist arthrodesis with local or bone graft, distal ulna excision, PIN neurectomy (Right Wrist)  Patient Location: PACU  Anesthesia Type:MAC combined with regional for post-op pain  Level of Consciousness: awake, alert  and oriented  Airway & Oxygen Therapy: Patient Spontanous Breathing and Patient connected to nasal cannula oxygen  Post-op Assessment: Report given to RN, Post -op Vital signs reviewed and stable and Patient moving all extremities  Post vital signs: Reviewed and stable  Last Vitals:  Vitals Value Taken Time  BP 115/79 09/02/19 1051  Temp    Pulse 64 09/02/19 1056  Resp 21 09/02/19 1057  SpO2 92 % 09/02/19 1056  Vitals shown include unvalidated device data.  Last Pain:  Vitals:   09/02/19 0610  TempSrc:   PainSc: 8          Complications: No apparent anesthesia complications

## 2019-09-02 NOTE — Plan of Care (Signed)
  Problem: Education: Goal: Knowledge of General Education information will improve Description: Including pain rating scale, medication(s)/side effects and non-pharmacologic comfort measures Outcome: Progressing   Problem: Activity: Goal: Risk for activity intolerance will decrease Outcome: Progressing   Problem: Pain Managment: Goal: General experience of comfort will improve Outcome: Progressing   

## 2019-09-02 NOTE — Progress Notes (Signed)
PT EVALUATION  Clinical impression: Tx focused on education for RUE RICE, gait mechanics with SPC, and functional transfers. PTA Pt states he was fully independent with community ambulation with [facility] for uneven ground (in RUE to help with LLE weakness). Pt reports full independence with heavy and light ADLs & IADLs. Pt plans to return to single level house with daughter and mother for assistance during morning and evening hours. Pt verbalized understanding to education provided, but due to lethargy will have to verify in the morning. Currently due to mobility, patient appears to be safe to follow surgeon's recommendations for therapy for RUE management. Plan to follow acutely to address deficits listed below.     09/02/19 1539  PT Visit Information  Last PT Received On 09/02/19  Assistance Needed +1  History of Present Illness Kourtland Coopman is an 54 y.o. 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 feet.)  Pain Intervention(s) Monitored during session;Patient requesting pain meds-RN notified  Cognition  Arousal/Alertness Lethargic  Behavior During Therapy Restless  Overall Cognitive Status Within Functional Limits for tasks assessed  Bed Mobility  Overal bed mobility Modified Independent  General bed mobility comments Pt demonstrates  Transfers  Overall transfer level Needs assistance  Equipment used None  Transfers Sit to/from Stand  Sit to Stand Min guard  General transfer comment Pt demonstrates need for CGA for safety secondary to drowiness  Ambulation/Gait  Ambulation/Gait assistance Min guard  Gait  Distance (Feet) 25 Feet  Assistive device None  Gait Pattern/deviations Step-through pattern;Shuffle;Staggering left;Staggering right;Wide base of support (decreased knee extension bilaterally in stance)  General Gait Details Pt demonstrated mild unsteadiness but did not lose balance   Gait velocity decreased  Balance  Overall balance assessment Needs assistance  Sitting-balance support No upper extremity supported  Sitting balance-Leahy Scale Good  Standing balance support No upper extremity supported  Standing balance-Leahy Scale Good  Exercises  Exercises Other exercises  Other Exercises  Other Exercises Educated on RICE principals, elevation of extremity and cane usage in non-dominant hand  PT - End of Session  Activity Tolerance Patient tolerated treatment well;Patient limited by lethargy;Patient limited by pain  Patient left in bed;with call bell/phone within reach;with bed alarm set;with family/visitor present;with nursing/sitter in room;with SCD's reapplied  Nurse Communication Mobility status   PT - Assessment/Plan  PT Plan Current plan remains appropriate  PT Visit Diagnosis Unsteadiness on feet (R26.81);Muscle weakness (generalized) (M62.81);Pain  Pain - Right/Left Right  Pain - part of body Hand  PT Frequency (ACUTE ONLY) Min 5X/week  Recommendations for Other Services OT consult  Follow Up Recommendations Follow surgeon's recommendation for DC plan and follow-up therapies  PT equipment None recommended by PT  AM-PAC PT "6 Clicks" Mobility Outcome Measure (Version 2)  Help needed turning from your back to your side while in a flat bed without using bedrails? 4  Help needed moving from lying on your back to sitting on the side of a flat bed without using bedrails? 4  Help needed moving to and from a bed to a chair (including a wheelchair)? 4  Help needed standing up from  a chair using your arms (e.g., wheelchair or bedside chair)? 4  Help needed to walk in hospital room?  4  Help needed climbing 3-5 steps with a railing?  3  6 Click Score 23  Consider Recommendation of Discharge To: Home with no services  PT Time Calculation  PT Start Time (ACUTE ONLY) 1548  PT Stop Time (ACUTE ONLY) 1607  PT Time Calculation (min) (ACUTE ONLY) 19 min  PT General Charges  $$ ACUTE PT VISIT 1 Visit  PT Evaluation  $PT Eval Low Complexity 1 Low

## 2019-09-02 NOTE — Anesthesia Procedure Notes (Signed)
Anesthesia Regional Block: Supraclavicular block   Pre-Anesthetic Checklist: ,, timeout performed, Correct Patient, Correct Site, Correct Laterality, Correct Procedure, Correct Position, site marked, Risks and benefits discussed, pre-op evaluation,  At surgeon's request and post-op pain management  Laterality: Right  Prep: Maximum Sterile Barrier Precautions used, chloraprep       Needles:  Injection technique: Single-shot  Needle Type: Echogenic Stimulator Needle     Needle Length: 9cm  Needle Gauge: 22     Additional Needles:   Procedures:,,,, ultrasound used (permanent image in chart),,,,  Narrative:  Start time: 09/02/2019 7:02 AM End time: 09/02/2019 7:05 AM Injection made incrementally with aspirations every 5 mL.  Performed by: Personally  Anesthesiologist: Kaylyn Layer, MD  Additional Notes: Risks, benefits, and alternative discussed. Patient gave consent for procedure. Patient prepped and draped in sterile fashion. Sedation administered, patient remains easily responsive to voice. Relevant anatomy identified with ultrasound guidance. Local anesthetic given in 5cc increments with no signs or symptoms of intravascular injection. No pain or paraesthesias with injection. Patient monitored throughout procedure with signs of LAST or immediate complications. Tolerated well. Ultrasound image placed in chart.  Dustin Greenhouse, MD

## 2019-09-02 NOTE — Op Note (Signed)
PREOPERATIVE DIAGNOSIS: Chronic right distal radius displaced nonunion  POSTOPERATIVE DIAGNOSIS: Same  ATTENDING PHYSICIAN: Maudry Mayhew. Jeannie Fend, III, MD who was present and scrubbed for the entire case   ASSISTANT SURGEON: None.   ANESTHESIA: Regional with MAC  SURGICAL PROCEDURES: 1.  Right total wrist arthrodesis with local autograft 2.  Right distal ulna resection 3.  Right PIN neurectomy 4.  Right scaphoid excision  SURGICAL INDICATIONS: Patient is a 54 year old male who was seen and evaluated by me in clinic.  Back in September he had a fall onto an outstretched right arm where he was found to have a displaced intra-articular distal radius fracture.  He did not undergo treatment for his wrist fracture at the time surgically but does sound like he did have a short course of immobilization with a cast and braces.  He was doing reasonably well but then approximately 3 weeks ago he had a fall again onto an outstretched right arm.  He then had continued pain and dysfunction of the right arm he was subsequently seen in the [facility], ER.  Radiographs at that time showed a severely displaced distal radius nonunion with significant ulnar positive variance and destruction of the proximal lunate and scaphoid.  I had a long discussion with him regarding potential treatment options and he did wish to proceed forward with surgical intervention.  Surgery would require a total wrist arthrodesis, excision of distal ulna as well as a PIN neurectomy.  After having this discussion he did wish to proceed and presents today for that.  FINDING: There was a highly comminuted and displaced distal radius fracture with complete nonunion.  There was abundant fibrous tissue around the fracture components.  The palmar and dorsal fracture fragments were all excised as well as the distal ulna.  Successful arthrodesis was then achieved using a dorsal arthrodesis plate.  Bone graft from the distal ulna with and excised  scaphoid were used as local autograft.  DESCRIPTION OF PROCEDURE: The patient was identified in the preoperative holding area where the risk benefits and alternatives of the procedure were discussed with the patient.  These include but are not limited to infection, bleeding, damage to surrounding structures including blood vessels and nerves, pain, stiffness, malunion, nonunion, implant failure and need for additional procedures.  Informed consent was obtained at that time the patient's right arm was marked with a surgical marking pen.  He then went a right upper extremity plexus block by anesthesia.  He was then brought back to the operative suite where timeout was performed identifying the correct patient operative site.  He was positioned supine on the operative table with his hand outstretched on a hand table.  A tourniquet was placed on his upper arm.  The arm was then prepped and draped in usual sterile fashion.  The patient was induced under MAC sedation.  The limb was exsanguinated and the tourniquet was inflated.  Standard dorsal approach to the wrist was utilized with a longitudinal incision over the wrist.  Radial and ulnar skin flaps were developed and full-thickness.  The EPL tendon was identified and the third compartment was released, transposing the tendon radially.  The fourth compartment was then released and the [facility] tendons were mobilized ulnarly.  In doing so we immediately visualized the dorsal, comminuted fracture fragments.  15 blade was used to incise the dorsal risk wrist capsule in line with the surgical incision.  Radial and ulnar flaps to the capsule were elevated.  This allowed for the beginnings of  the dorsal, nonunion fracture components to be isolated and removed.  These were removed in piecemeal fashion both with rondure as well as sharp dissection I surrounding them circumferentially.  Continued dissection was performed working radially where there was a large radial styloid  fragment.  The radial styloid fragment was carefully dissected utilizing freer and wood handle elevator to protect the deep radial artery as well as the tendons of the first dorsal compartment.  Successful removal of the fragment was also achieved.  Once all of the nonunion components were excised we began to visualize the integrity of the proximal row bones of the wrist.  There was a void of bone within the proximal proximal pole the scaphoid as well as the radial aspect of the lunate.  The capitate and distal radial appear to be grossly intact.  At this point we continued to dissect ulnarly and the distal ulna was visualized.  This was done through dissection directly through the DRUJ which was full of scar tissue.  This was excised until the articular cartilage of the distal ulna was visualized.  Circumferential dissection was then performed around the distal ulna and a sagittal saw was used to excise the distal ulna.  This was done approximately 1 cm proximal to the remaining, intact distal radius.  The distal ulna was saved for later use as bone graft.  Attention was then turned back to the radiocarpal joint.  There was significant destruction of the scaphoid so the remaining, distal portion of the scaphoid was excised sharply.  At this point, a saw was used to make a perpendicular cut along the proximal pole of the lunate as well.  This was to allow for a smooth, week and flush arthrodesis site between the lunate and the capitate.  Attention was then turned to the midcarpal joint at the articular cartilage between the lunate and capitate as well as between the capitate and hamate was excised just.  This was done and an treatment was performed back to cancellous bone.  Once this was performed was able to manipulate the wrist and allow for a excellent compressive site at the radial lunate junction as well as appropriate alignment of the midcarpal and hand distally.  At this point the Acumed, neutral wrist  arthrodesis plate was positioned appropriately along the dorsal aspect of the wrist.  This was placed deep to the extensor tendons and pinned into place along third metacarpal as well as the distal radial shaft.  Fluoroscopic images were obtained which showed appropriate positioning and alignment of the plate.  At this point multiple locking screws were placed into the plate distally within the third metacarpal.  The wrist was once again manipulated to ensured appropriate reduction and alignment of the arthrodesis site.  A nonlocking cortical screw was placed in the oblong hole within the plate proximally utilizing a compressive technique.  A second compression screw was then placed more proximally further compressing through the arthrodesis site.  Multiple locking screws were then placed proximally within the distal radius.    At this point the tourniquet was released and the patient had return of brisk capillary refill to all of his digits.  Hemostasis was then achieved utilizing bipolar electrocautery as well as Gelfoam and thrombin.  The wound was copiously irrigated with normal saline.  The distal ulna was utilized to remove the cancellous bone within the intramedullary canal as well as the ulnar head.  This was mixed with some of the bone from the excised scaphoid as  well as fragments from the nonunion site.  This was all mixed with Vivigen bone graft.  This combination was then all packed within the arthrodesis site at the radiolunate as well as the midcarpal joints.  The remaining, intact portions of the extensor retinaculum were then closed over top of the fourth compartment tendons.  This was done using 3-0 Vicryl sutures.  A Hemovac drain was then placed along the dorsal aspect of the wrist exiting out the forearm proximally.  The skin was then closed with interrupted 4-0 Prolene suture.  Xeroform, 4 x 4's and a well-padded sugar tong splint were then applied.  The patient was awoken from his anesthesia  and taken the PACU in stable condition.  He tolerated the procedure well and there are no complications.  RADIOGRAPHIC INTERPRETATION: PA and lateral radiographs of the wrist were obtained intraoperative under fluoroscopic images.  This shows successful, improved alignment of the wrist with a dorsal plate and screw fixation.  No fractures or dislocations are noted.  ESTIMATED BLOOD LOSS: 200 mL  TOURNIQUET TIME: 1 hour and 50 minutes  SPECIMENS: None  POSTOPERATIVE PLAN: Patient will be admitted to the hospital for overnight observation.  We will plan on discharge home first thing tomorrow.  We will keep him in his sugar tong splint until his postoperative visit in approximately 10 to 14 days at which point we will remove his sutures and likely place him into either removable wrist splint or cast.  IMPLANTS: Acumed neutral dorsal arthrodesis plate with locking and nonlocking screws.

## 2019-09-02 NOTE — Anesthesia Procedure Notes (Signed)
Procedure Name: MAC Date/Time: 09/02/2019 7:35 AM Performed by: Amadeo Garnet, CRNA Pre-anesthesia Checklist: Patient identified, Emergency Drugs available, Suction available and Patient being monitored Patient Re-evaluated:Patient Re-evaluated prior to induction Oxygen Delivery Method: Simple face mask Preoxygenation: Pre-oxygenation with 100% oxygen Induction Type: IV induction Placement Confirmation: positive ETCO2 Dental Injury: Teeth and Oropharynx as per pre-operative assessment

## 2019-09-03 DIAGNOSIS — S52501A Unspecified fracture of the lower end of right radius, initial encounter for closed fracture: Secondary | ICD-10-CM | POA: Diagnosis not present

## 2019-09-03 LAB — HIV ANTIBODY (ROUTINE TESTING W REFLEX): HIV Screen 4th Generation wRfx: NONREACTIVE

## 2019-09-03 MED ORDER — DOCUSATE SODIUM 100 MG PO CAPS: 100.0000 mg | ORAL_CAPSULE | Freq: Two times a day (BID) | ORAL | 0 refills | Status: AC

## 2019-09-03 MED ORDER — HYDROCODONE-ACETAMINOPHEN 7.5-325 MG PO TABS: 1.0000 | ORAL_TABLET | ORAL | 0 refills | Status: AC | PRN

## 2019-09-03 MED ORDER — METHOCARBAMOL 500 MG PO TABS: 500.0000 mg | ORAL_TABLET | Freq: Four times a day (QID) | ORAL | 1 refills | Status: AC | PRN

## 2019-09-03 MED FILL — Thrombin For Soln 5000 Unit: CUTANEOUS | Qty: 5000 | Status: AC

## 2019-09-03 NOTE — Progress Notes (Signed)
Physical Therapy Treatment Patient Details Name: Dustin Vaughn MRN: [id number] DOB: Feb 22, 1966 Today's Date: 09/03/2019    History of Present Illness Dustin Vaughn is an 54 y.o. 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  Pt is day 0 s/p Right total wrist arthrodesis with local autograft    PT Comments    Tx focused on gait progression with Dustin Vaughn & education for TE to address gait deficits.  Pt demonstrates reduced bilateral knee extension control, but demonstrate full neutral knee extension in sitting. Pt given HEP printout for Quad sets, SLR, SAQ, LAQ, TKEs, heel raises and gait with cane handout. Pt verbalized understanding for continued performance. Pt was able to ambulate much further today, with SPC, with no LOB demonstrated, showing improved independence with functional mobility and able to safely return home. Plan to continue following acutely to progress gait and balance before transition to next level of care.   Follow Up Recommendations  Follow surgeon's recommendation for DC plan and follow-up therapies     Equipment Recommendations  None recommended by PT    Recommendations for Other Services OT consult     Precautions / Restrictions Precautions Precautions: None Restrictions Weight Bearing Restrictions: Yes RUE Weight Bearing: Non weight bearing    Mobility  Bed Mobility Overal bed mobility: Modified Independent                Transfers Overall transfer level: Modified independent Equipment used: Straight cane Transfers: Sit to/from Stand Sit to Stand: Supervision         General transfer comment: Pt demonstrates reduced knee extension in standing  Ambulation/Gait Ambulation/Gait assistance: Supervision Gait Distance (Feet): 225 Feet Assistive device: Straight cane Gait Pattern/deviations: Step-through pattern(knees flexed bilaterally)   Gait velocity  interpretation: 1.31 - 2.62 ft/sec, indicative of limited community ambulator General Gait Details: Pt demonstrates improved steadiness with SPC usage today being safe to ambulate short distances with supervision     Balance Overall balance assessment: Needs assistance Sitting-balance support: No upper extremity supported Sitting balance-Leahy Scale: Good     Standing balance support: Single extremity supported Standing balance-Leahy Scale: Good        Cognition Arousal/Alertness: Awake/alert Behavior During Therapy: WFL for tasks assessed/performed Overall Cognitive Status: Within Functional Limits for tasks assessed                Exercises General Exercises - Lower Extremity Quad Sets: AROM;Both;10 reps;Seated(long sitting with legs up) Short Arc Quad: AROM;Both;10 reps;Seated Straight Leg Raises: AROM;Both;10 reps;Supine Heel Raises: Strengthening;Both;10 reps;Standing Other Exercises Other Exercises: Pre-gait at all for knee extension    General Comments        Pertinent Vitals/Pain Pain Assessment: Faces Faces Pain Scale: Hurts little more Pain Location: R wrist, back, feet Pain Descriptors / Indicators: Aching;Throbbing;Other (Comment)           PT Goals (current goals can now be found in the care plan section) Acute Rehab PT Goals Patient Stated Goal: Return home & eat Progress towards PT goals: Progressing toward goals    Frequency    Min 5X/week      PT Plan Current plan remains appropriate    Co-evaluation              AM-PAC PT "6 Clicks" Mobility   Outcome Measure  Help needed turning from your back to your side while in Dustin flat bed without using  bedrails?: None Help needed moving from lying on your back to sitting on the side of Dustin flat bed without using bedrails?: None Help needed moving to and from Dustin bed to Dustin chair (including Dustin wheelchair)?: None Help needed standing up from Dustin chair using your arms (e.g., wheelchair or bedside  chair)?: None Help needed to walk in Vaughn room?: None Help needed climbing 3-5 steps with Dustin railing? : Dustin Little 6 Click Score: 23    End of Session Equipment Utilized During Treatment: Oxygen(2 L [place] ) Activity Tolerance: Patient tolerated treatment well Patient left: in chair;with call bell/phone within reach Nurse Communication: Mobility status PT Visit Diagnosis: Unsteadiness on feet (R26.81);Muscle weakness (generalized) (M62.81);Pain Pain - Right/Left: Right Pain - part of body: Hand     Time: 7471-8550 PT Time Calculation (min) (ACUTE ONLY): 24 min  Charges:  $Gait Training: 8-22 mins $Therapeutic Exercise: 8-22 mins                     Dustin Vaughn PT, Dustin Vaughn Acute Rehab Dustin Vaughn Dustin Vaughn P: [phone]    Dustin Vaughn Dustin Vaughn 09/03/2019, 10:45 AM

## 2019-09-03 NOTE — Evaluation (Signed)
Occupational Therapy Evaluation Patient Details Name: Dustin Vaughn MRN: [id number] DOB: 10-11-1965 Today's Date: 09/03/2019    History of Present Illness Dustin Vaughn is an 54 y.o. 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  Pt is day 0 s/p Right total wrist arthrodesis with local autograft   Clinical Impression   PTA, pt was living at home with his mom, and pt was independent with ADL/IADL and functional mobility. Pt was on 2lnc at home. Pt currently requires minguard for functional mobility with ADL completion. He reports numbness and tingling in digits 1,2,3 on R hand. Digits were noted to have increased edema, education pt on importance of mobilizing digits, elevating RUE and icing hand to assist with swelling. Provided pt with HEP handout for UE exercises. Will continue to follow acutely while pt is in acute care to allow safe d/c home with family and following surgeons recommendation for DC plan and follow up therapies.     Follow Up Recommendations  Follow surgeon's recommendation for DC plan and follow-up therapies    Equipment Recommendations  None recommended by OT    Recommendations for Other Services       Precautions / Restrictions Precautions Precautions: None Restrictions Weight Bearing Restrictions: Yes RUE Weight Bearing: Non weight bearing      Mobility Bed Mobility Overal bed mobility: Modified Independent             General bed mobility comments: pt sitting in recliner upon arrival  Transfers Overall transfer level: Modified independent Equipment used: Straight cane Transfers: Sit to/from Stand Sit to Stand: Min guard         General transfer comment: Pt demonstrates reduced knee extension in standing    Balance Overall balance assessment: Needs assistance Sitting-balance support: No upper extremity supported Sitting balance-Leahy Scale: Good      Standing balance support: Single extremity supported Standing balance-Leahy Scale: Good                             ADL either performed or assessed with clinical judgement   ADL Overall ADL's : Needs assistance/impaired Eating/Feeding: Set up;Sitting   Grooming: Minimal assistance;Sitting   Upper Body Bathing: Minimal assistance;Sitting   Lower Body Bathing: Sit to/from stand;Minimal assistance Lower Body Bathing Details (indicate cue type and reason): minA to access BLE Upper Body Dressing : Minimal assistance;Sitting Upper Body Dressing Details (indicate cue type and reason): minA for correct wearing fo sling Lower Body Dressing: Minimal assistance;Sit to/from stand Lower Body Dressing Details (indicate cue type and reason): minA to access BLE  Toilet Transfer: Min guard(SPC) Toilet Transfer Details (indicate cue type and reason): mingaurd for safety and appropriate use of SPC Toileting- Clothing Manipulation and Hygiene: Min guard;Sit to/from stand       Functional mobility during ADLs: Min guard;Cane General ADL Comments: educated pt on energy conservation strategies with ADL completion;educated pt on importance of elevating RUE and mobilizing shoulder and digits;increased DoE 3/4 with activity in standing     Vision         Perception     Praxis      Pertinent Vitals/Pain Pain Assessment: Faces Faces Pain Scale: Hurts little more Pain Location: R wrist, back, feet Pain Descriptors / Indicators: Aching;Throbbing;Other (Comment) Pain Intervention(s): Limited activity within patient's tolerance;Monitored during session;Repositioned     Hand Dominance Right  Extremity/Trunk Assessment Upper Extremity Assessment Upper Extremity Assessment: RUE deficits/detail RUE Deficits / Details: noted swelling in fingers;pt reports pain with finger extension, unable to make tight fist;reports tingling and numbness in thumb, index finger, and middle  finger;limited assessment due to immobilization from sugar tong splint;pt reports pain in trapezius muscles, palpated and very large knots noted. myofascial release and applied heat to the area  RUE Sensation: decreased light touch RUE Coordination: decreased fine motor;decreased gross motor   Lower Extremity Assessment Lower Extremity Assessment: Generalized weakness   Cervical / Trunk Assessment Cervical / Trunk Assessment: Kyphotic   Communication Communication Communication: No difficulties   Cognition Arousal/Alertness: Awake/alert Behavior During Therapy: WFL for tasks assessed/performed Overall Cognitive Status: Within Functional Limits for tasks assessed                                     General Comments       Exercises Exercises: General Upper Extremity General Exercises - Upper Extremity Shoulder Flexion: AAROM;Right;10 reps Shoulder Extension: AAROM;Right;10 reps Digit Composite Flexion: AROM;10 reps;Seated Composite Extension: AROM;10 reps General Exercises - Lower Extremity Quad Sets: AROM;Both;10 reps;Seated(long sitting with legs up) Short Arc Quad: AROM;Both;10 reps;Seated Straight Leg Raises: AROM;Both;10 reps;Supine Heel Raises: Strengthening;Both;10 reps;Standing Other Exercises Other Exercises: shoulder rolls AROM forward and backward x10  Other Exercises: educated pt on importance of mobilizing digits and elevating UE   Shoulder Instructions      Home Living Family/patient expects to be discharged to:: Private residence Living Arrangements: Parent Available Help at Discharge: Family;Available PRN/intermittently(mom goes around during day 8 hours minimum) Type of Home: House Home Access: Ramped entrance     Home Layout: One level     Bathroom Shower/Tub: Walk-in shower;Door   [employer] Toilet: Handicapped height Bathroom Accessibility: No   Home Equipment: Shower seat - built in;Grab bars - tub/shower;Walker - 2 wheels;Cane -  single point(oxygen)          Prior Functioning/Environment Level of Independence: Independent with assistive device(s)(Required SPC on unever surfaces in RUE)                 OT Problem List: Decreased range of motion;Decreased activity tolerance;Decreased safety awareness;Decreased knowledge of precautions;Cardiopulmonary status limiting activity;Pain;Impaired UE functional use;Increased edema;Impaired sensation      OT Treatment/Interventions: Self-care/ADL training;Therapeutic exercise;Energy conservation;DME and/or AE instruction;Therapeutic activities;Patient/family education    OT Goals(Current goals can be found in the care plan section) Acute Rehab OT Goals Patient Stated Goal: Return home & eat OT Goal Formulation: With patient Time For Goal Achievement: 09/17/19 Potential to Achieve Goals: Good ADL Goals Pt Will Perform Upper Body Dressing: with modified independence Additional ADL Goal #1: Pt will demonstrate independence with 3 energy conservation strategies during ADL completion. Additional ADL Goal #2: Pt will demonstrate independence with HEP and elevating RUE.  OT Frequency: Min 2X/week   Barriers to D/C:            Co-evaluation              AM-PAC OT "6 Clicks" Daily Activity     Outcome Measure Help from another person eating meals?: A Little Help from another person taking care of personal grooming?: A Little Help from another person toileting, which includes using toliet, bedpan, or urinal?: A Little Help from another person bathing (including washing, rinsing, drying)?: A Little Help from another person to put on and taking off regular upper body clothing?:  A Little Help from another person to put on and taking off regular lower body clothing?: A Little 6 Click Score: 18   End of Session Equipment Utilized During Treatment: Gait belt(sling) Nurse Communication: Mobility status  Activity Tolerance: Patient tolerated treatment well;Patient  limited by pain Patient left: in chair;with call bell/phone within reach;with chair alarm set  OT Visit Diagnosis: Other abnormalities of gait and mobility (R26.89);History of falling (Z91.81);Pain Pain - Right/Left: Right Pain - part of body: Hand;Arm                Time: 1110-1135 OT Time Calculation (min): 25 min Charges:  OT General Charges $OT Visit: 1 Visit OT Evaluation $OT Eval Moderate Complexity: 1 Mod OT Treatments $Self Care/Home Management : 8-22 mins  Helene Kelp OTR/L Acute Rehabilitation Services Office: [phone]   Wyn Forster 09/03/2019, 11:51 AM

## 2019-09-03 NOTE — Plan of Care (Signed)
  Problem: Pain Managment: Goal: General experience of comfort will improve Outcome: Progressing   

## 2019-09-03 NOTE — Discharge Instructions (Signed)
Discharge Instructions ° °- Keep dressings in place. Do not remove them. °- The dressings must stay dry °- Take all medication as prescribed. Transition to over the counter pain medication as your pain improves °- Keep the hand elevated over the next 48-72 hours to help with pain and swelling °- Move all digits not restricted by the dressings regularly to prevent stiffness °- Please call to schedule a follow up appointment with Dr. Male Iafrate and therapy at [phone] for 10-14 days following surgery °- Your pain medication have been send digitally to your pharmacy °

## 2019-09-03 NOTE — Progress Notes (Signed)
Discharge paperwork and instructions given to pt. Pt not in distress and tolerated well. 

## 2019-09-03 NOTE — Discharge Summary (Signed)
Patient ID: Dustin Vaughn MRN: [id number] DOB/AGE: 09/30/1965 54 y.o.  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 Anxiety   . Arthritis    lower back  . Asthma   . Bipolar disorder (HCC)   . COPD (chronic obstructive pulmonary disease) (HCC)   . Depression   . Diabetes mellitus without complication (HCC)    type 2 - no meds, diet controlled  . Dyspnea    occasional with exertion  . GERD (gastroesophageal reflux disease)   . Gout   . Hoarseness   . Hyperlipidemia   . Hypertension   . Learning disorder    8th grade education, special ed classes, has trouble reading and some writing  . Pneumonia    several times  . Requires supplemental oxygen    mainly at night - 2L via [place] nightly and prn during day  . Seasonal allergies   . Sleep apnea    does not wear CPAP  . Wears dentures   . Wears glasses     Discharge Diagnoses:  Principal Problem:   Closed fracture of right distal radius Active Problems:   Chronic obstructive pulmonary disease (HCC)   Diabetes mellitus (HCC)   Low back pain   Apnea, sleep   Essential hypertension   Dyslipidemia   Obesity (BMI 30.0-34.9)   Surgeries: Procedure(s): Right total wrist arthrodesis with local or bone graft, distal ulna excision, PIN neurectomy on 09/02/2019    Consultants: PT/OT/Hospitalist  Discharged Condition: Improved  Hospital Course: Dustin Vaughn is an 54 y.o. 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  They were brought to the operating room on 09/02/2019 and underwent Procedure(s): Right total wrist arthrodesis with local or bone graft, distal ulna excision, PIN neurectomy.    They were given perioperative antibiotics:   Anti-infectives (From admission, onward)   Start     Dose/Rate Route Frequency Ordered Stop   09/02/19 1530  ceFAZolin (ANCEF) IVPB 2g/100 mL premix     2 g 200 mL/hr over 30 Minutes Intravenous Every 8 hours 09/02/19 0725 09/03/19 0633   09/02/19 0400  ceFAZolin (ANCEF) 3 g in dextrose 5 % 50 mL IVPB     3 g 100 mL/hr over 30 Minutes Intravenous On call to O.R. 08/30/19 1017 09/02/19 0745   08/30/19 1030  ceFAZolin (ANCEF) 3 g in dextrose 5 % 50 mL IVPB  Status:  Discontinued     3 g 100 mL/hr over 30 Minutes Intravenous On call to O.R. 08/30/19 1015 08/30/19 1017    .  They were given sequential compression devices, early ambulation, for DVT prophylaxis.  Recent vital signs:  Patient Vitals for the past 24 hrs:  BP Temp Temp src Pulse Resp SpO2  09/03/19 0831 -- -- -- -- -- 97 %  09/03/19 0736 124/71 98.2 F (36.8 C) Oral (!) 53 17 96 %  09/03/19 0427 117/72 98.2 F (36.8 C) Oral (!) 58 17 95 %  09/03/19 0052 -- 97.9 F (36.6 C) Oral -- -- --  09/02/19 2318 136/73 97.9 F (36.6 C) -- 67 16 93 %  09/02/19 1928 (!) 145/89 99 F (37.2 C) Oral 69 16 93 %  09/02/19 1506 123/79 -- Oral (!) 51 17 93 %  09/02/19 1400 -- 97.6 F (36.4 C) -- (!) 51 13 93 %  09/02/19 1338 116/83 -- -- (!) 56 17 94 %  09/02/19 1300 114/69 -- -- (!) 52 13 95 %  09/02/19 1235 (!) 98/56 -- -- (!) 53 13 94 %  09/02/19 1204 (!) 103/55 -- -- (!) 54 16 95 %  09/02/19 1135 106/69 -- -- (!) 54 19 93 %  .  Recent laboratory studies: No results found.  Discharge Medications:   Allergies as of 09/03/2019      Reactions   Trazodone Hcl Other (See Comments)   Syncope      Medication List    STOP taking these medications   meloxicam 15 MG tablet Commonly known as: MOBIC     TAKE these medications   acetaminophen 325 MG tablet Commonly known as: TYLENOL Take 650 mg by mouth 3 (three) times daily as needed for moderate pain.   albuterol (2.5 MG/3ML) 0.083% nebulizer solution Commonly known as:  PROVENTIL Take 2.5 mg by nebulization every 6 (six) hours as needed for wheezing or shortness of breath.   allopurinol 300 MG tablet Commonly known as: ZYLOPRIM Take 300 mg by mouth daily.   baclofen 10 MG tablet Commonly known as: LIORESAL Take 10 mg by mouth 3 (three) times daily.   budesonide-formoterol 80-4.5 MCG/ACT inhaler Commonly known as: SYMBICORT Inhale 2 puffs into the lungs 2 (two) times daily.   docusate sodium 100 MG capsule Commonly known as: COLACE Take 1 capsule (100 mg total) by mouth 2 (two) times daily.   escitalopram 10 MG tablet Commonly known as: LEXAPRO Take 10 mg by mouth daily.   furosemide 20 MG tablet Commonly known as: LASIX Take 20 mg by mouth 2 (two) times daily as needed for edema.   gabapentin 600 MG tablet Commonly known as: NEURONTIN Take 600 mg by mouth 3 (three) times daily. For 3 days   HYDROcodone-acetaminophen 7.5-325 MG tablet Commonly known as: NORCO Take 1-2 tablets by mouth every 4 (four) hours as needed for severe pain (pain score 7-10).   hydrOXYzine 10 MG tablet Commonly known as: ATARAX/VISTARIL Take 10 mg by mouth in the morning, at noon, and at bedtime.   lisinopril-hydrochlorothiazide 20-12.5 MG tablet Commonly known as: ZESTORETIC Take 1 tablet by mouth daily.   loratadine 10 MG tablet Commonly known as: CLARITIN Take 10 mg by mouth daily.   lovastatin 10 MG tablet Commonly known as: MEVACOR Take 10 mg by mouth at bedtime.   methocarbamol 500 MG tablet Commonly known as: ROBAXIN Take 1 tablet (500 mg total) by mouth every 6 (six) hours as needed for muscle spasms.   mometasone 50 MCG/ACT nasal spray Commonly known as: NASONEX Place 2 sprays into the nose daily as needed (allergies).   mupirocin ointment 2 % Commonly known as: BACTROBAN Apply 1 application topically daily as needed for wound care.   pantoprazole 40 MG tablet Commonly known as: PROTONIX Take 40 mg by mouth daily.   potassium chloride  10 MEQ tablet Commonly known as: KLOR-CON Take 10 mEq by mouth 2 (two) times daily as needed (when taking lasix).   pregabalin 200 MG capsule Commonly known as: LYRICA Take 200 mg by mouth in the morning, at noon, and at bedtime.       Diagnostic Studies: No results found.  They benefited maximally from their hospital stay and there were no complications.     Disposition: Discharge disposition: 01-Home or Self Care  Discharge Instructions    Call MD / Call 911   Complete by: As directed    If you experience chest pain or shortness of breath, CALL 911 and be transported to the hospital emergency room.  If you develope a fever above 101 F, pus (white drainage) or increased drainage or redness at the wound, or calf pain, call your surgeon's office.   Constipation Prevention   Complete by: As directed    Drink plenty of fluids.  Prune juice may be helpful.  You may use a stool softener, such as Colace (over the counter) 100 mg twice a day.  Use MiraLax (over the counter) for constipation as needed.   Diet - low sodium heart healthy   Complete by: As directed    Increase activity slowly as tolerated   Complete by: As directed      Follow-up Information    Cain Saupe III, MD. Schedule an appointment as soon as possible for a visit in 14 week(s).   Contact information: [address] [place] [address] [phone]            Signed: Ernest Mallick, MD  09/03/2019, 11:21 AM

## 2019-09-03 NOTE — Progress Notes (Addendum)
   Ortho Hand Progress Note  Subjective: No acute events last night. Some mild shortness of breath which improved with O2 per [place] and IS. Pain controlled.    Objective: Vital signs in last 24 hours: Temp:  [97.6 F (36.4 C)-99 F (37.2 C)] 98.2 F (36.8 C) (03/09 0427) Pulse Rate:  [51-69] 58 (03/09 0427) Resp:  [12-19] 17 (03/09 0427) BP: (98-145)/(55-89) 117/72 (03/09 0427) SpO2:  [92 %-98 %] 95 % (03/09 0427)  Intake/Output from previous day: 03/08 0701 - 03/09 0700 In: 480 [P.O.:240; IV Piggyback:150] Out: 3090 [Urine:2925; Drains:115; Blood:50] Intake/Output this shift: No intake/output data recorded.  No results for input(s): HGB in the last 72 hours. No results for input(s): WBC, RBC, HCT, PLT in the last 72 hours. No results for input(s): NA, K, CL, CO2, BUN, CREATININE, GLUCOSE, CALCIUM in the last 72 hours. No results for input(s): LABPT, INR in the last 72 hours.  Aaox3 nad Resp nonlabored RRR RUE: splint in place. Dressings c/d/i. Exposed digits with minimal swelling. Intact active digit motion. Intact sensation throughout the digits but subjective paresthesias to the MN distribution. Fingers wwp with bcr.   Assessment/Plan: 54 yo M s/p R total wrist arthrodesis, distal ulna resection, POD1 - Continue current pain medication - O2 per [place] as needed. On home O2 - IS - Complete post op abx - Elevate RUE and ice packs as needed - Encourage digit ROM to prevent stiffness - regular diet - drain pulled  Plan for d/c home later today.   Dustin Vaughn 09/03/2019, 7:25 AM  [phone]

## 2019-09-04 ENCOUNTER — Encounter: Payer: Self-pay | Admitting: *Deleted

## 2020-04-01 ENCOUNTER — Encounter (HOSPITAL_COMMUNITY): Payer: Self-pay

## 2020-04-01 ENCOUNTER — Emergency Department (HOSPITAL_COMMUNITY): Payer: Medicaid Other

## 2020-04-01 ENCOUNTER — Emergency Department (HOSPITAL_COMMUNITY)
Admission: EM | Admit: 2020-04-01 | Discharge: 2020-04-01 | Discharge status: Against Medical Advice | Payer: Medicaid Other | Attending: Emergency Medicine | Admitting: Emergency Medicine

## 2020-04-01 ENCOUNTER — Other Ambulatory Visit: Payer: Self-pay

## 2020-04-01 DIAGNOSIS — E119 Type 2 diabetes mellitus without complications: Secondary | ICD-10-CM | POA: Insufficient documentation

## 2020-04-01 DIAGNOSIS — Z20822 Contact with and (suspected) exposure to covid-19: Secondary | ICD-10-CM | POA: Diagnosis not present

## 2020-04-01 DIAGNOSIS — I10 Essential (primary) hypertension: Secondary | ICD-10-CM | POA: Diagnosis not present

## 2020-04-01 DIAGNOSIS — R0682 Tachypnea, not elsewhere classified: Secondary | ICD-10-CM | POA: Insufficient documentation

## 2020-04-01 DIAGNOSIS — R0602 Shortness of breath: Secondary | ICD-10-CM

## 2020-04-01 DIAGNOSIS — F17210 Nicotine dependence, cigarettes, uncomplicated: Secondary | ICD-10-CM | POA: Insufficient documentation

## 2020-04-01 DIAGNOSIS — R0781 Pleurodynia: Secondary | ICD-10-CM

## 2020-04-01 DIAGNOSIS — J45909 Unspecified asthma, uncomplicated: Secondary | ICD-10-CM | POA: Insufficient documentation

## 2020-04-01 DIAGNOSIS — J449 Chronic obstructive pulmonary disease, unspecified: Secondary | ICD-10-CM | POA: Insufficient documentation

## 2020-04-01 DIAGNOSIS — Z79899 Other long term (current) drug therapy: Secondary | ICD-10-CM | POA: Insufficient documentation

## 2020-04-01 DIAGNOSIS — R071 Chest pain on breathing: Secondary | ICD-10-CM | POA: Insufficient documentation

## 2020-04-01 LAB — CBC
HCT: 41.3 % (ref 39.0–52.0)
Hemoglobin: 14.3 g/dL (ref 13.0–17.0)
MCH: 30.8 pg (ref 26.0–34.0)
MCHC: 34.6 g/dL (ref 30.0–36.0)
MCV: 88.8 fL (ref 80.0–100.0)
Platelets: 397 10*3/uL (ref 150–400)
RBC: 4.65 MIL/uL (ref 4.22–5.81)
RDW: 13.1 % (ref 11.5–15.5)
WBC: 9.2 10*3/uL (ref 4.0–10.5)
nRBC: 0 % (ref 0.0–0.2)

## 2020-04-01 LAB — BASIC METABOLIC PANEL
Anion gap: 8 (ref 5–15)
BUN: 9 mg/dL (ref 6–20)
CO2: 26 mmol/L (ref 22–32)
Calcium: 9.1 mg/dL (ref 8.9–10.3)
Chloride: 97 mmol/L — ABNORMAL LOW (ref 98–111)
Creatinine, Ser: 0.8 mg/dL (ref 0.61–1.24)
GFR calc non Af Amer: >60 mL/min (ref >60)
Glucose, Bld: 159 mg/dL — ABNORMAL HIGH (ref 70–99)
Potassium: 4.3 mmol/L (ref 3.5–5.1)
Sodium: 131 mmol/L — ABNORMAL LOW (ref 135–145)

## 2020-04-01 LAB — TROPONIN I (HIGH SENSITIVITY)
Troponin I (High Sensitivity): 3 ng/L (ref <18)
Troponin I (High Sensitivity): 3 ng/L (ref <18)

## 2020-04-01 LAB — RESPIRATORY PANEL BY RT PCR (FLU A&B, COVID)
Influenza A by PCR: NEGATIVE
Influenza B by PCR: NEGATIVE
SARS Coronavirus 2 by RT PCR: NEGATIVE

## 2020-04-01 LAB — BRAIN NATRIURETIC PEPTIDE: B Natriuretic Peptide: 30.2 pg/mL (ref 0.0–100.0)

## 2020-04-01 MED ORDER — ALBUTEROL SULFATE HFA 108 (90 BASE) MCG/ACT IN AERS
8.0000 | INHALATION_SPRAY | Freq: Once | RESPIRATORY_TRACT | Status: AC
  Administered 2020-04-01: 8 via RESPIRATORY_TRACT
  Filled 2020-04-01: qty 6.7

## 2020-04-01 MED ORDER — IPRATROPIUM-ALBUTEROL 0.5-2.5 (3) MG/3ML IN SOLN: 3.0000 mL | Freq: Once | RESPIRATORY_TRACT | Status: DC

## 2020-04-01 MED ORDER — IPRATROPIUM BROMIDE HFA 17 MCG/ACT IN AERS
2.0000 | INHALATION_SPRAY | Freq: Once | RESPIRATORY_TRACT | Status: AC
  Administered 2020-04-01: 2 via RESPIRATORY_TRACT
  Filled 2020-04-01: qty 12.9
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
## 2020-04-01 NOTE — ED Notes (Signed)
Pt in xray, called xray and transport will bring pt back to room when done.

## 2020-04-01 NOTE — ED Provider Notes (Signed)
[facility]   CSN: [id number] Arrival date & time: 04/01/20  1013     History No chief complaint on file.   Dustin Vaughn is a 54 y.o. male.  54 year old male with past medical history below including COPD, type 2 diabetes mellitus, hypertension, hyperlipidemia, bipolar disorder who presents with shortness of breath and chest pain.  Patient reports 2 weeks of progressively worsening shortness of breath.  Reported to EMS that he uses 4 L of oxygen at night for his COPD.  He reports severe chest pain when he coughs and takes a deep breath.  He denies any known fevers or sick contacts.  No leg swelling or pain.  He reports losing weight recently, no weight gain.  He does use inhalers at home and states that they do seem to help his breathing.  He is vaccinated against COVID-19.  The history is provided by the patient.       Past Medical History:  Diagnosis Date  . Anxiety   . Arthritis    lower back  . Asthma   . Bipolar disorder (HCC)   . COPD (chronic obstructive pulmonary disease) (HCC)   . Depression   . Diabetes mellitus without complication (HCC)    type 2 - no meds, diet controlled  . Dyspnea    occasional with exertion  . GERD (gastroesophageal reflux disease)   . Gout   . Hoarseness   . Hyperlipidemia   . Hypertension   . Learning disorder    8th grade education, special ed classes, has trouble reading and some writing  . Pneumonia    several times  . Requires supplemental oxygen    mainly at night - 2L via [place] nightly and prn during day  . Seasonal allergies   . Sleep apnea    does not wear CPAP  . Wears dentures   . Wears glasses     Patient Active Problem List   Diagnosis Date Noted  . Closed fracture of right distal radius 09/02/2019  . Essential hypertension 09/02/2019  . Dyslipidemia 09/02/2019  . Obesity (BMI 30.0-34.9) 09/02/2019  . Dysfunction of eustachian tube 10/22/2015  . Ceruminosis  10/22/2015  . Hoarse 10/22/2015  . Ear drum perforation 10/22/2015  . Cellulitis of abdominal wall 03/16/2015  . Chronic obstructive pulmonary disease (HCC) 03/12/2015  . Diabetes mellitus (HCC) 03/12/2015  . Breath shortness 03/12/2015  . Apnea, sleep 03/12/2015  . Exomphalos 03/12/2015  . Low back pain 09/03/2012  . Lumbosacral spondylosis 09/03/2012  . Cough 05/08/2012  . Can't get food down 05/08/2012  . Brash 05/08/2012    Past Surgical History:  Procedure Laterality Date  . cyst removed     right testicle at [facility], [place]  . MULTIPLE TOOTH EXTRACTIONS    . PR LAP, VENTRAL HERNIA REPAIR,REDUCIBLE   02/2015  . PR TRANSURETHRAL ELEC-SURG PROSTATECTOM    04/2016  . TONSILLECTOMY     [facility], [place]  . TRANSURETHRAL RESECTION PROSTATE, CYSTOSCOPY, BILATERAL RETROGRADES    04/2016  . WRIST FUSION Right 09/02/2019   Procedure: Right total wrist arthrodesis with local or bone graft, distal ulna excision, PIN neurectomy;  Surgeon: Ernest Mallickreighton, James J III, MD;  Location: [facility] OR;  Service: Orthopedics;  Laterality: Right;  150min       No family history on file.  Social History   Tobacco Use  . Smoking status: Current Every Day Smoker    Packs/day: 0.50    Years: 49.00  Pack years: 24.50    Types: Cigarettes  . Smokeless tobacco: Never Used  . Tobacco comment: smoking since age 66 yr per patient 08/30/19  Vaping Use  . Vaping Use: Never used  Substance Use Topics  . Alcohol use: Not on file  . Drug use: Yes    Types: Marijuana, Methamphetamines    Comment: Last use 08/30/19 marijuana, last use methamphetamines 02/2020    Home Medications Prior to Admission medications   Medication Sig Start Date End Date Taking? Authorizing Provider  acetaminophen (TYLENOL) 325 MG tablet Take 650 mg by mouth 3 (three) times daily as needed for moderate pain.    [provider]  albuterol (PROVENTIL) (2.5 MG/3ML) 0.083% nebulizer solution Take 2.5 mg by nebulization every 6  (six) hours as needed for wheezing or shortness of breath.    [provider]  allopurinol (ZYLOPRIM) 300 MG tablet Take 300 mg by mouth daily.    [provider]  baclofen (LIORESAL) 10 MG tablet Take 10 mg by mouth 3 (three) times daily.    [provider]  budesonide-formoterol (SYMBICORT) 80-4.5 MCG/ACT inhaler Inhale 2 puffs into the lungs 2 (two) times daily.    [provider]  docusate sodium (COLACE) 100 MG capsule Take 1 capsule (100 mg total) by mouth 2 (two) times daily. 09/03/19   Cain Saupe III, MD  escitalopram (LEXAPRO) 10 MG tablet Take 10 mg by mouth daily. 08/03/19   [provider]  furosemide (LASIX) 20 MG tablet Take 20 mg by mouth 2 (two) times daily as needed for edema. 08/16/19   [provider]  gabapentin (NEURONTIN) 600 MG tablet Take 600 mg by mouth 3 (three) times daily. For 3 days    [provider]  HYDROcodone-acetaminophen (NORCO) 7.5-325 MG tablet Take 1-2 tablets by mouth every 4 (four) hours as needed for severe pain (pain score 7-10). 09/03/19   Cain Saupe III, MD  hydrOXYzine (ATARAX/VISTARIL) 10 MG tablet Take 10 mg by mouth in the morning, at noon, and at bedtime.    [provider]  lisinopril-hydrochlorothiazide (PRINZIDE,ZESTORETIC) 20-12.5 MG tablet Take 1 tablet by mouth daily.    [provider]  loratadine (CLARITIN) 10 MG tablet Take 10 mg by mouth daily.    [provider]  lovastatin (MEVACOR) 10 MG tablet Take 10 mg by mouth at bedtime.    [provider]  methocarbamol (ROBAXIN) 500 MG tablet Take 1 tablet (500 mg total) by mouth every 6 (six) hours as needed for muscle spasms. 09/03/19   Ernest Mallick, MD  mometasone (NASONEX) 50 MCG/ACT nasal spray Place 2 sprays into the nose daily as needed (allergies).     [provider]  mupirocin ointment (BACTROBAN) 2 % Apply 1 application topically daily as needed for wound care.  07/16/19   [provider]  pantoprazole (PROTONIX) 40 MG tablet Take 40 mg by mouth daily.    [provider]  potassium chloride (KLOR-CON) 10 MEQ tablet Take 10 mEq by mouth 2 (two) times daily as needed (when taking lasix).  08/16/19   [provider]  pregabalin (LYRICA) 200 MG capsule Take 200 mg by mouth in the morning, at noon, and at bedtime.     [provider]    Allergies    Trazodone hcl and Phenergan [promethazine hcl]  Review of Systems   Review of Systems All other systems reviewed and are negative except that which was mentioned in HPI  Physical Exam Updated Vital Signs BP 116/81   Pulse 88   Temp 98.3 F (36.8 C)   Resp 16   Ht 6\' 1"  (1.854 m)   Wt 117.9 kg   SpO2 100%   BMI 34.30 kg/m   Physical Exam Vitals and nursing note reviewed.  Constitutional:      Appearance: He is well-developed.     Comments: Anxious, mildly distressed  HENT:     Head: Normocephalic and atraumatic.  Eyes:     Conjunctiva/sclera: Conjunctivae normal.  Cardiovascular:     Rate and Rhythm: Normal rate and regular rhythm.     Heart sounds: Normal heart sounds. No murmur heard.   Pulmonary:     Comments: tachypneic with shallow respirations and difficulty taking in deep breaths, diminished b/l, no obvious crackles or rales but poor inspiratory effort Abdominal:     General: Bowel sounds are normal. There is no distension.     Palpations: Abdomen is soft.     Tenderness: There is no abdominal tenderness.  Musculoskeletal:     Cervical back: Neck supple.     Right lower leg: No edema.     Left lower leg: No edema.  Skin:    General: Skin is warm and dry.  Neurological:     Mental Status: He is alert and oriented to person, place, and time.     Comments: Fluent speech  Psychiatric:     Comments: anxious     ED Results / Procedures / Treatments   Labs (all labs ordered are listed, but only abnormal results are displayed) Labs Reviewed    BASIC METABOLIC PANEL - Abnormal; Notable for the following components:      Result Value   Sodium 131 (*)    Chloride 97 (*)    Glucose, Bld 159 (*)    All other components within normal limits  RESPIRATORY PANEL BY RT PCR (FLU A&B, COVID)  CBC  BRAIN NATRIURETIC PEPTIDE  TROPONIN I (HIGH SENSITIVITY)  TROPONIN I (HIGH SENSITIVITY)    EKG EKG Interpretation  Date/Time:  Wednesday April 01 2020 10:13:54 EDT Ventricular Rate:  100 PR Interval:  190 QRS Duration: 92 QT Interval:  342 QTC Calculation: 441 R Axis:   -71 Text Interpretation: Sinus rhythm with occasional Premature ventricular complexes Pulmonary disease pattern Left anterior fascicular block Abnormal ECG Interpretation limited secondary to artifact Confirmed by 10-10-2003 [phone]) on 04/01/2020 11:11:44 AM   Radiology DG Chest 2 View  Result Date: 04/01/2020 CLINICAL DATA:  Shortness of breath EXAM: CHEST - 2 VIEW COMPARISON:  03/21/2020 FINDINGS: Patient is rotated. Low lung volumes. Patchy bibasilar density likely reflecting atelectasis. No pleural effusion or pneumothorax. Cardiomediastinal contours are within normal limits. No acute osseous abnormality. IMPRESSION: Patchy bibasilar atelectasis. Electronically Signed   By: 03/23/2020 M.D.   On: 04/01/2020 10:57    Procedures Procedures (including critical care time)  Medications Ordered in ED Medications  ipratropium-albuterol (DUONEB) 0.5-2.5 (3) MG/3ML nebulizer solution 3 mL (has no administration in time range)  ipratropium (ATROVENT HFA) inhaler 2 puff (2 puffs Inhalation Given 04/01/20 1150)  albuterol (VENTOLIN HFA) 108 (90 Base) MCG/ACT inhaler 8 puff (8 puffs Inhalation Given 04/01/20 1150)    ED Course  I have reviewed the triage vital signs and the nursing notes.  Pertinent labs & imaging results that were available during my care of the patient were reviewed by me and considered in my medical decision making (see chart for details).  MDM Rules/Calculators/A&P                          Pt uncomfortable, tachypneic, and taking shallow respirations on exam.  No wheezing but diminished breath sounds.  Gave albuterol and Atrovent.  Chest x-ray shows atelectasis, no obvious infiltrate or fluid.  COVID-19 negative.  BNP and troponin normal.  Normal CBC and BMP.  Given normal white count and no obvious infiltrate on chest x-ray as well as no hypoxia, pneumonia seems less likely.  No evidence of heart failure.  I considered PE and planned to obtain CTA of chest to r/o PE or occult infiltrate.  I was contacted by nurse after I ordered the study and informed that pt took out his IV and wanted to leave AMA. I went to see pt to discuss and he had already left before I could talk to him.  Final Clinical Impression(s) / ED Diagnoses Final diagnoses:  None    Rx / DC Orders ED Discharge Orders    None       Ruthvik Barnaby, Ambrose Finland, MD 04/01/20 1610

## 2020-04-01 NOTE — ED Notes (Signed)
Pt not willing to wait for further treatment, left AMA, pt and family oriented by this RN to wait to talk to provider. When this RN when to get Dr. Clarene Duke and return to his room pt was gone. Dr. Thomasene Ripple notified.

## 2020-08-25 DEATH — deceased

## 2021-06-12 IMAGING — CT CT WRIST*R* W/O CM
3 of 4 series · 9 of 35 positions shown, 11 images · non-contrast
Comparison: Radiograph same day, March 09, 2019 radiograph

CLINICAL DATA: Wrist fracture for 3 months, worsening pain

EXAM:
CT OF THE RIGHT WRIST WITHOUT CONTRAST
TECHNIQUE: Multidetector CT imaging of the right wrist was performed according
to the standard protocol. Multiplanar CT image reconstructions were
also generated.

[Series 5: upper ext 1.5 st · axial · 0.52mm/px · z∈[-526,-526]mm · 1 of 171 slices shown, 2 images]
[im 92/171  soft-tissue]
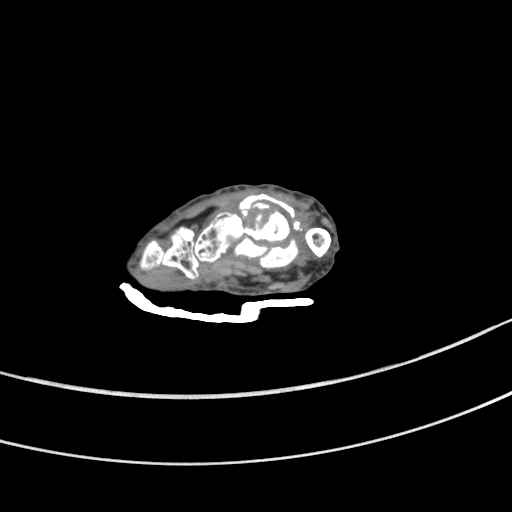
[im 92/171  bone]
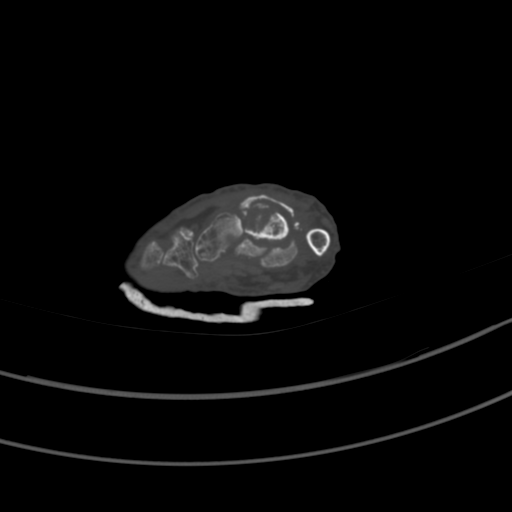

[Series 9: upper ext sag bone · sagittal · 0.23mm/px · 5 of 167 slices shown, 6 images]
[im 42/167  bone]
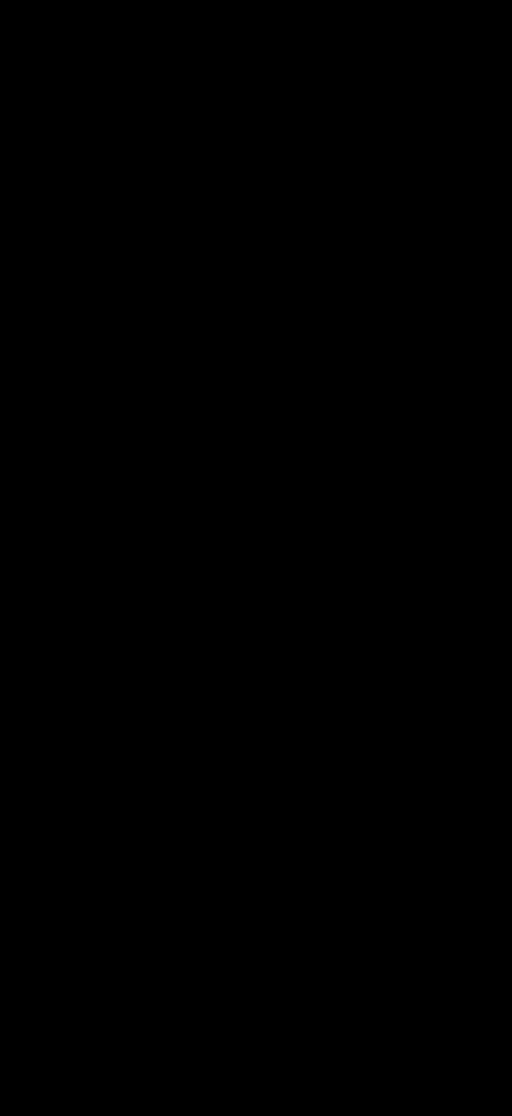
[im 59/167  bone]
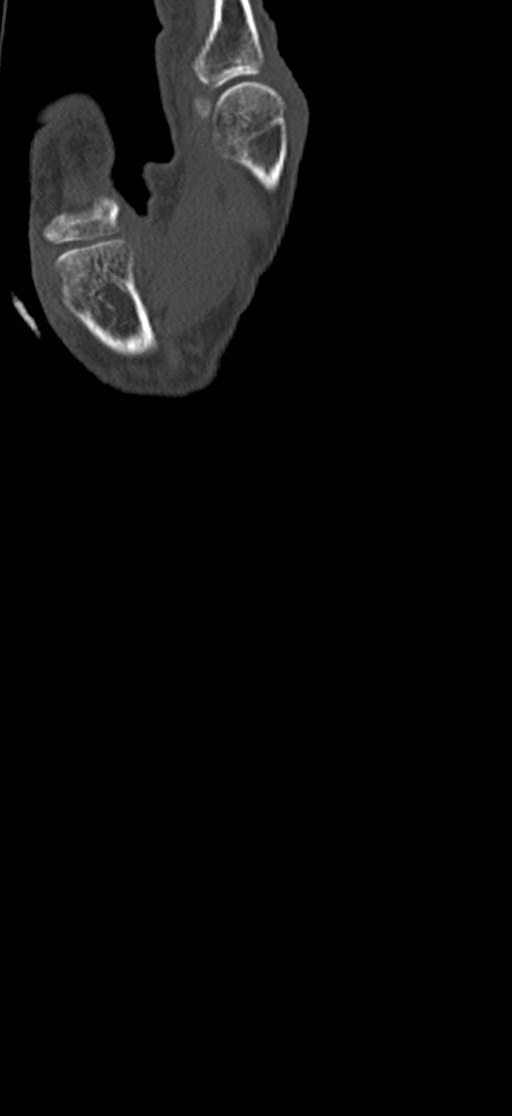
[im 75/167  soft-tissue]
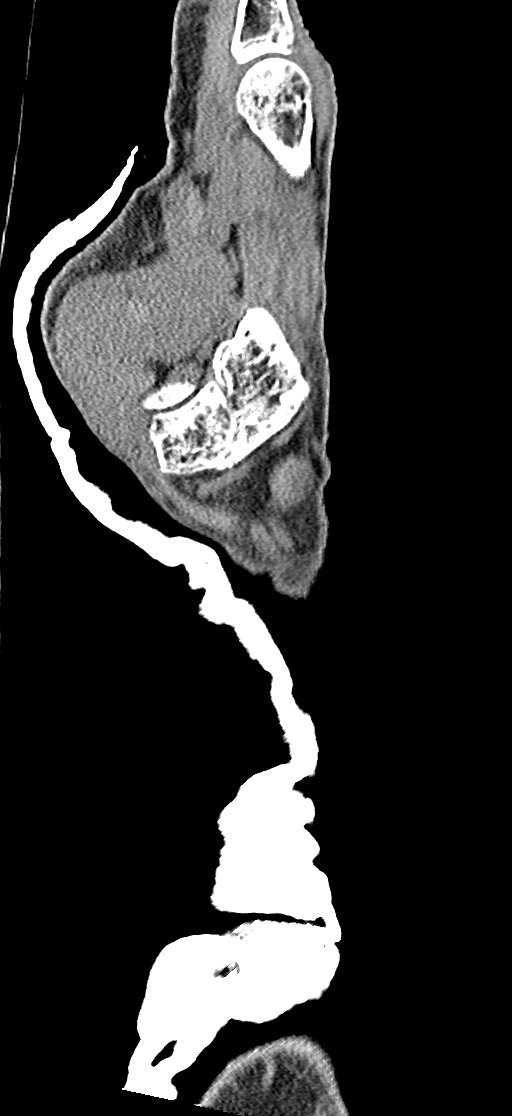
[im 75/167  bone]
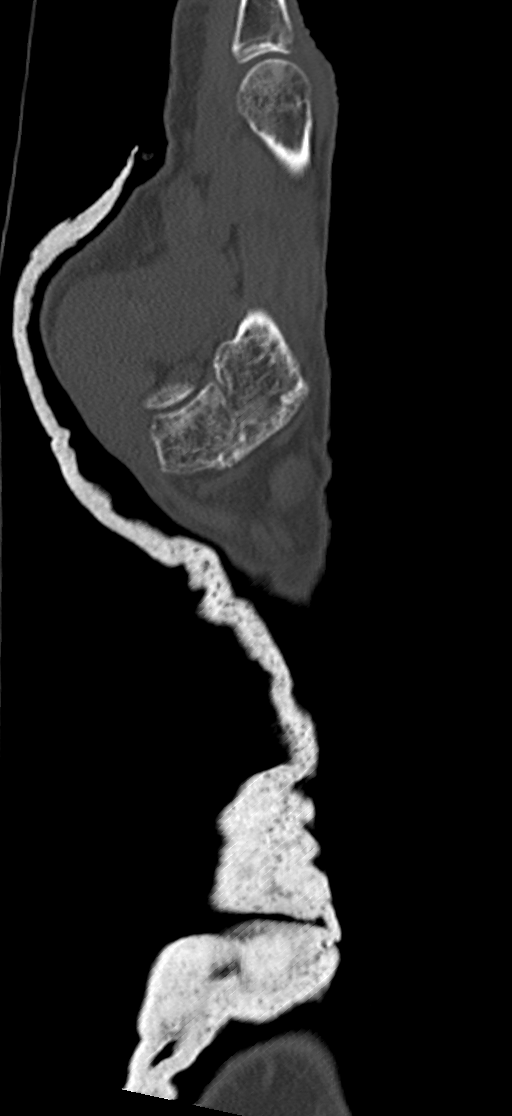
[im 92/167  bone]
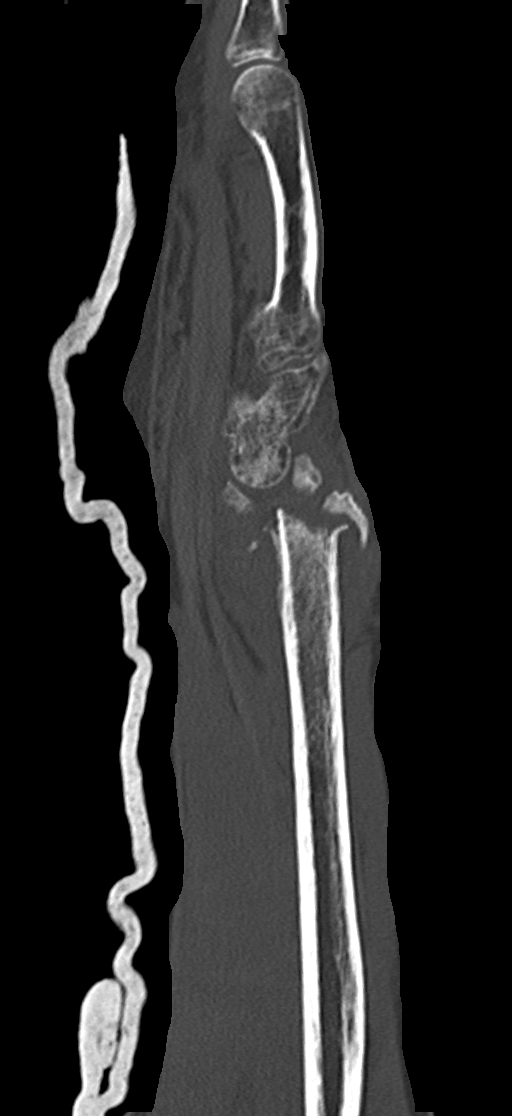
[im 108/167  bone]
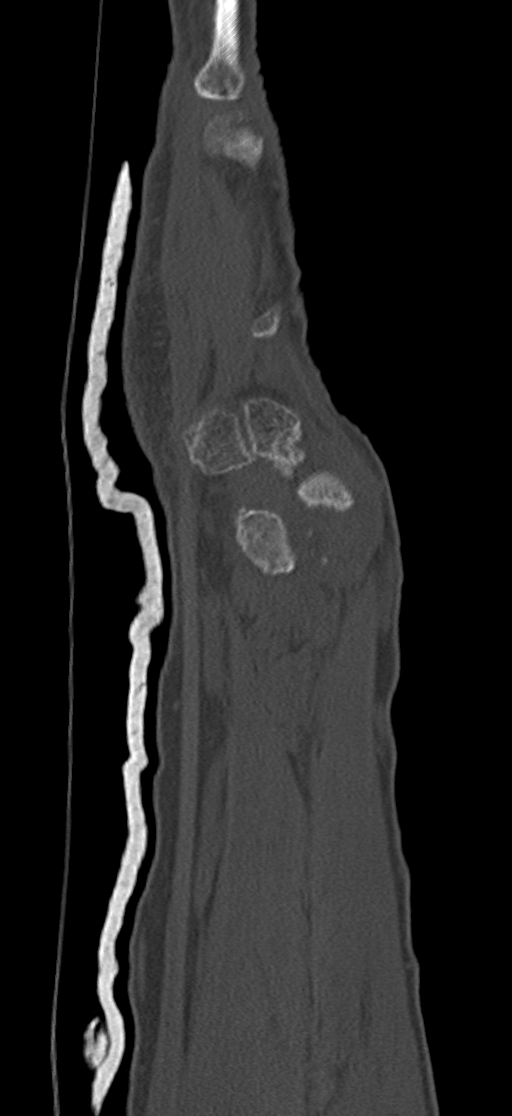

[Series 10: upper ext cor st · coronal · 0.23mm/px · 3 of 81 slices shown]
[im 17/81  bone]
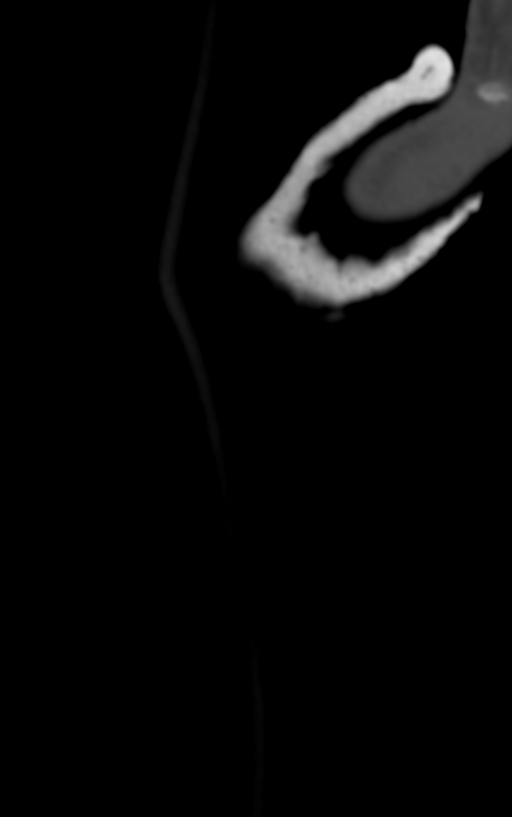
[im 33/81  bone]
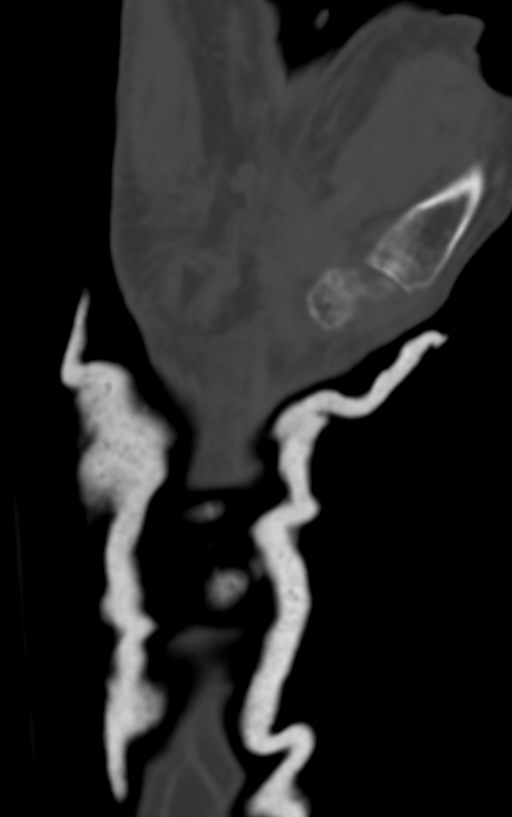
[im 49/81  bone]
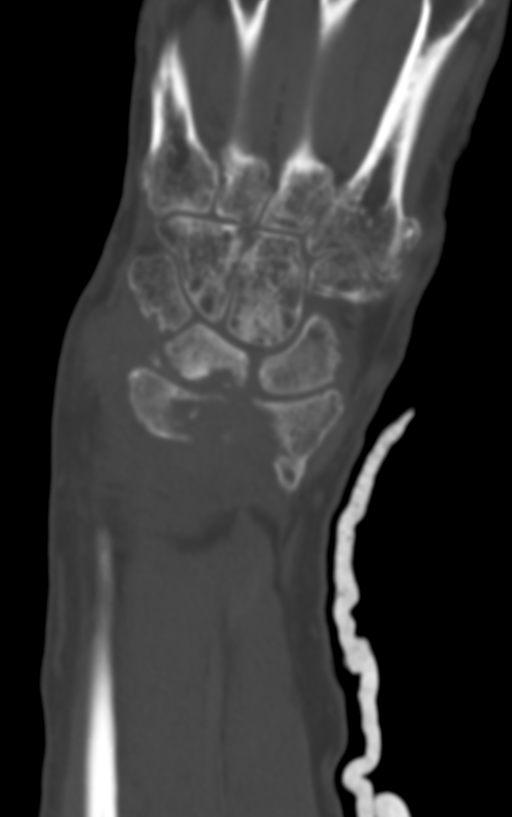

[9 of 35 positions shown; findings below may reference images not displayed]

FINDINGS: Bones/Joint/Cartilage

Again noted is a comminuted impacted intra-articular distal radius
fracture. There is interval worsening in the distal impaction of the
radial shaft. Nonunited distal radius fracture fragments are seen
along the volar and dorsal surface of the wrist. The distal radial
shaft appears to have caused impaction injury upon the proximal
scaphoid and inferior lunate. The ulna appears to be dorsally and
medially displaced. Nonunited ulnar styloid osseous fragments are
seen at the distal surface. There is diffuse osteopenia seen.

Ligaments

Suboptimally assessed by CT.

Muscles and Tendons

The muscles surrounding the wrist appear to be grossly intact
without focal atrophy or tear. The flexor and extensor tendons
appear to be grossly intact.

Soft tissues

Extensive dorsal soft tissue swelling is seen around the wrist. A
moderate radioulnar joint effusion is seen.
IMPRESSION: 1. Comminuted impacted intra-articular distal radius fracture with
interval worsening distal impaction of the radial shaft on the
scaphoid and lunate.
2. Multiple nonunited distal radius fracture fragments surrounding
the wrist with no significant interval healing.
3. Medial and dorsal dislocation of the distal ulna.
4. Mildly displaced nonunited ulnar styloid fracture fragments.
5. Moderate radioulnar joint effusion

## 2022-02-10 IMAGING — DX DG CHEST 2V
2 series · 2 of 2 positions shown · non-contrast
Comparison: 03/21/2020

CLINICAL DATA: Shortness of breath

EXAM:
CHEST - 2 VIEW

[chest lat]
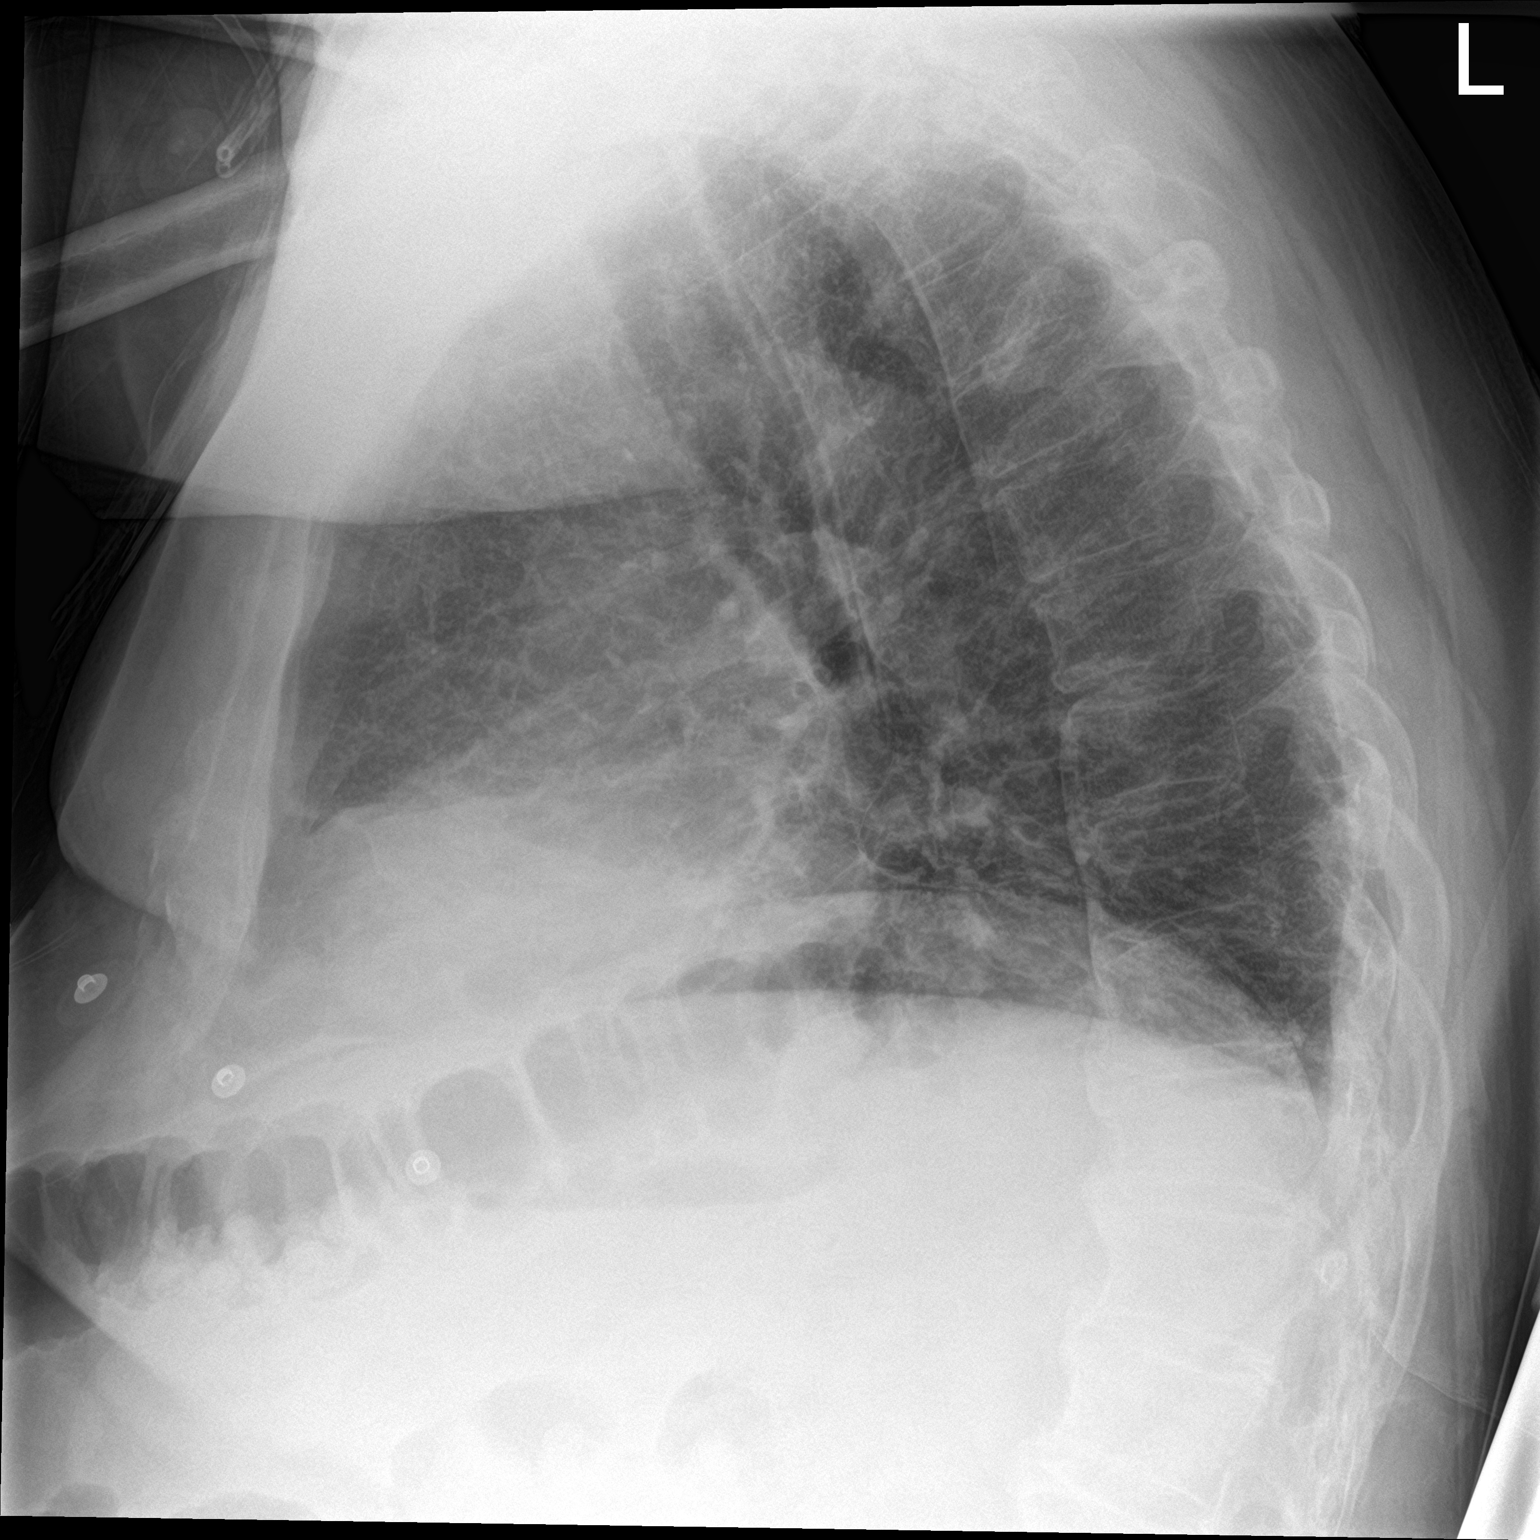

[chest ap]
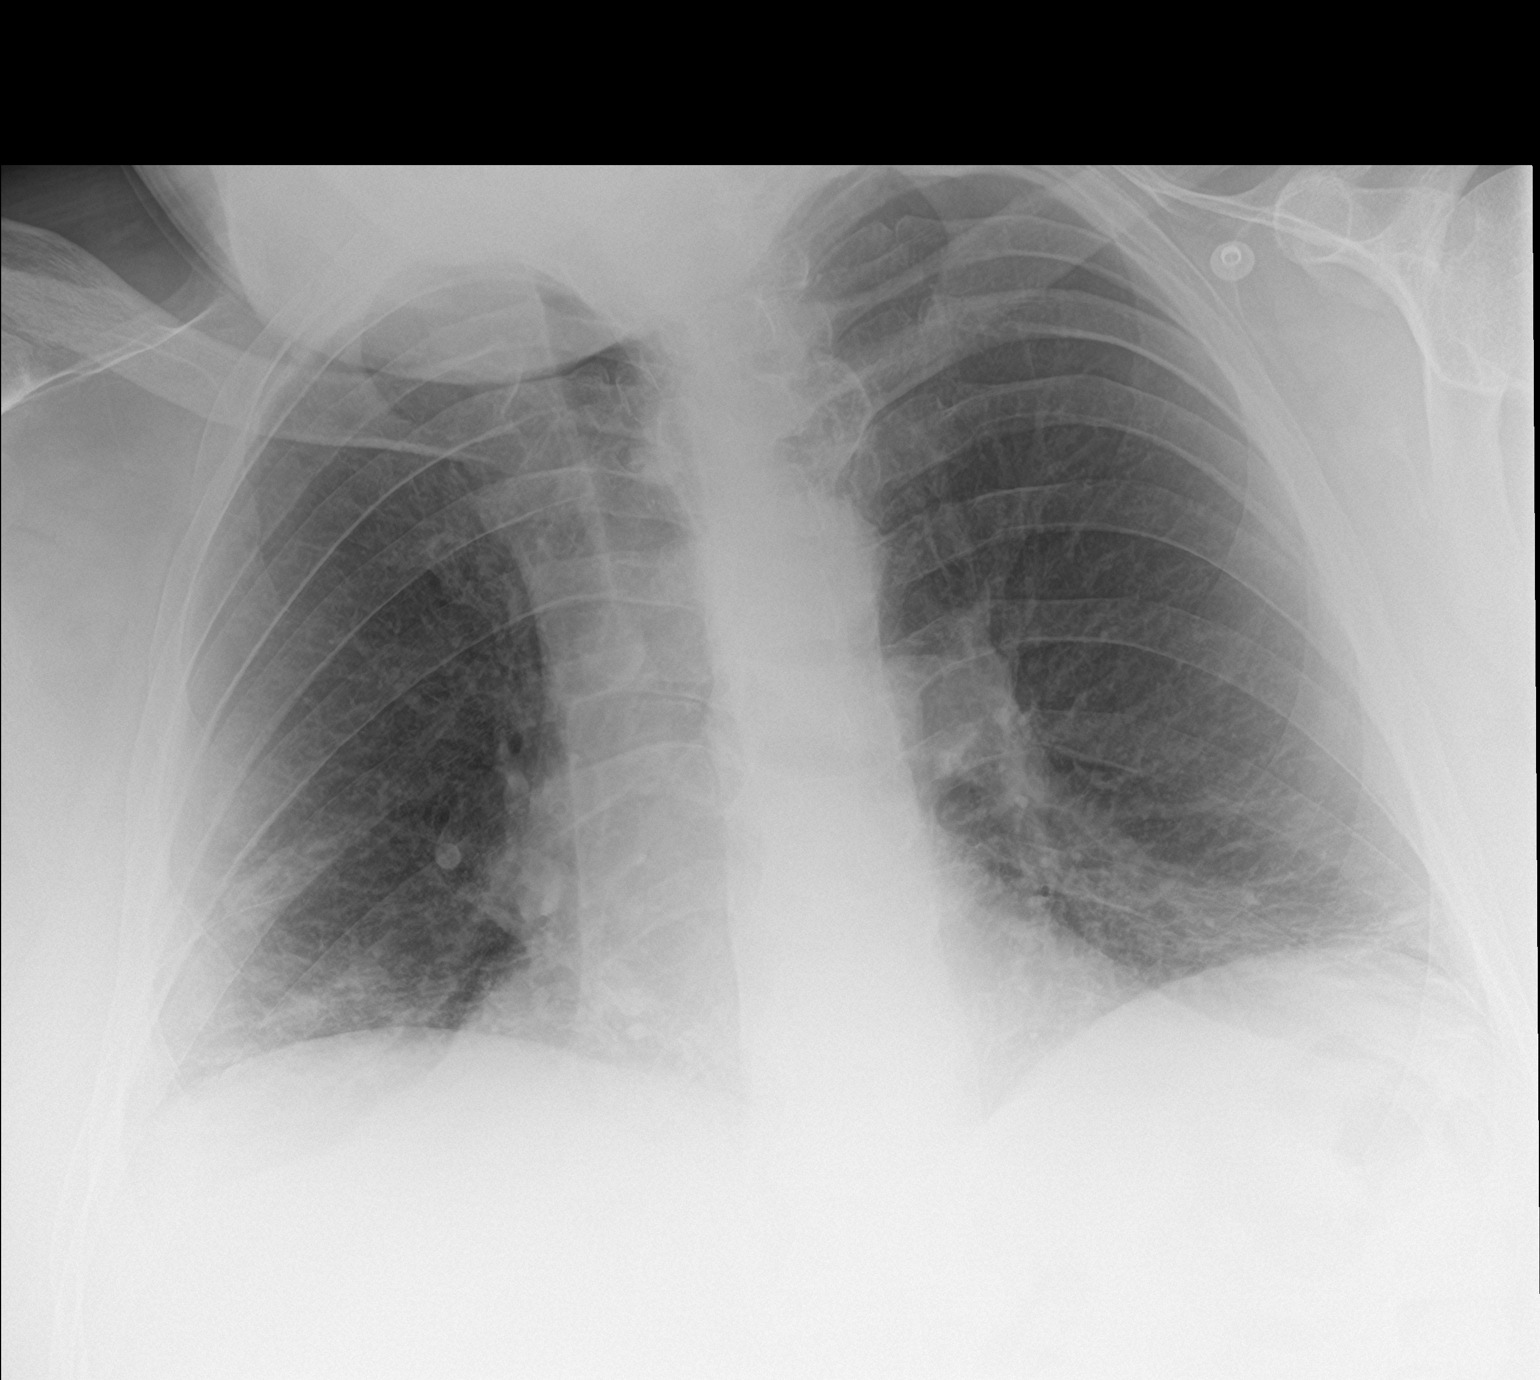

[2 of 2 positions shown; findings below may reference images not displayed]

FINDINGS: Patient is rotated. Low lung volumes. Patchy bibasilar density
likely reflecting atelectasis. No pleural effusion or pneumothorax.
Cardiomediastinal contours are within normal limits. No acute
osseous abnormality.
IMPRESSION: Patchy bibasilar atelectasis.
